# Patient Record
Sex: Female | Born: 1985
Health system: Southern US, Community
[De-identification: ages and names within clinical notes are randomized; demographics above are authoritative.]

## PROBLEM LIST (undated history)

## (undated) DIAGNOSIS — I491 Atrial premature depolarization: Secondary | ICD-10-CM

## (undated) DIAGNOSIS — I7774 Dissection of vertebral artery: Secondary | ICD-10-CM

## (undated) DIAGNOSIS — Z8619 Personal history of other infectious and parasitic diseases: Secondary | ICD-10-CM

## (undated) DIAGNOSIS — R002 Palpitations: Secondary | ICD-10-CM

## (undated) DIAGNOSIS — L700 Acne vulgaris: Secondary | ICD-10-CM

## (undated) HISTORY — DX: Atrial premature depolarization: I49.1

## (undated) HISTORY — PX: WISDOM TOOTH EXTRACTION: SHX21

## (undated) HISTORY — DX: Palpitations: R00.2

## (undated) HISTORY — DX: Personal history of other infectious and parasitic diseases: Z86.19

## (undated) HISTORY — DX: Acne vulgaris: L70.0

---
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
## 2004-04-07 ENCOUNTER — Other Ambulatory Visit: Admission: RE | Admit: 2004-04-07 | Discharge: 2004-04-07 | Payer: Self-pay | Admitting: Family Medicine

## 2005-03-16 ENCOUNTER — Other Ambulatory Visit: Admission: RE | Admit: 2005-03-16 | Discharge: 2005-03-16 | Payer: Self-pay | Admitting: Family Medicine

## 2010-11-07 LAB — RPR: RPR: NONREACTIVE

## 2010-11-07 LAB — HIV ANTIBODY (ROUTINE TESTING W REFLEX): HIV: NONREACTIVE

## 2010-11-07 LAB — GC/CHLAMYDIA PROBE AMP, GENITAL: Chlamydia: NEGATIVE

## 2010-11-07 LAB — ANTIBODY SCREEN: Antibody Screen: NEGATIVE

## 2010-11-07 LAB — HEPATITIS B SURFACE ANTIGEN: Hepatitis B Surface Ag: NEGATIVE

## 2011-03-13 NOTE — L&D Delivery Note (Signed)
Delivery Note At 9:18 PM a viable and healthy female was delivered via Vaginal, Spontaneous Delivery (Presentation: Left Occiput Anterior).  APGAR: 9, 9; weight 8 lb 8.7 oz (3875 g).   Placenta status: Intact, Spontaneous.  Cord: 3 vessels with the following complications: Nuchal, true Knot.    Anesthesia: Epidural  Episiotomy: None Lacerations: 2nd degree;Perineal Suture Repair: 3.0 chromic Est. Blood Loss (mL): 500  Mom to postpartum.  Baby to nursery-stable.  Neal Oshea D 05/28/2011, 9:53 PM

## 2011-05-06 LAB — STREP B DNA PROBE: GBS: NEGATIVE

## 2011-05-24 ENCOUNTER — Encounter (HOSPITAL_COMMUNITY): Payer: Self-pay | Admitting: *Deleted

## 2011-05-24 ENCOUNTER — Telehealth (HOSPITAL_COMMUNITY): Payer: Self-pay | Admitting: *Deleted

## 2011-05-24 NOTE — Telephone Encounter (Signed)
Preadmission screen  

## 2011-05-28 ENCOUNTER — Inpatient Hospital Stay (HOSPITAL_COMMUNITY)
Admission: AD | Admit: 2011-05-28 | Discharge: 2011-05-30 | DRG: 775 | Disposition: A | Discharge status: Home / Self Care | Payer: 59 | Attending: Obstetrics & Gynecology | Admitting: Obstetrics & Gynecology

## 2011-05-28 ENCOUNTER — Encounter (HOSPITAL_COMMUNITY): Payer: Self-pay | Admitting: *Deleted

## 2011-05-28 ENCOUNTER — Inpatient Hospital Stay (HOSPITAL_COMMUNITY): Payer: 59 | Admitting: Anesthesiology

## 2011-05-28 ENCOUNTER — Encounter (HOSPITAL_COMMUNITY): Payer: Self-pay | Admitting: Anesthesiology

## 2011-05-28 DIAGNOSIS — O99814 Abnormal glucose complicating childbirth: Secondary | ICD-10-CM | POA: Diagnosis present

## 2011-05-28 LAB — CBC
HCT: 36.3 % (ref 36.0–46.0)
RBC: 4.77 MIL/uL (ref 3.87–5.11)
RDW: 15.2 % (ref 11.5–15.5)
WBC: 12.8 10*3/uL — ABNORMAL HIGH (ref 4.0–10.5)

## 2011-05-28 MED ORDER — PRENATAL MULTIVITAMIN CH
1.0000 | ORAL_TABLET | Freq: Every day | ORAL | Status: DC
  Administered 2011-05-29: 1 via ORAL
  Filled 2011-05-28 (×2): qty 1

## 2011-05-28 MED ORDER — DIPHENHYDRAMINE HCL 25 MG PO CAPS: 25.0000 mg | ORAL_CAPSULE | Freq: Four times a day (QID) | ORAL | Status: DC | PRN

## 2011-05-28 MED ORDER — IBUPROFEN 600 MG PO TABS
600.0000 mg | ORAL_TABLET | Freq: Four times a day (QID) | ORAL | Status: DC | PRN
  Administered 2011-05-28: 600 mg via ORAL
  Filled 2011-05-28: qty 1

## 2011-05-28 MED ORDER — SIMETHICONE 80 MG PO CHEW: 80.0000 mg | CHEWABLE_TABLET | ORAL | Status: DC | PRN

## 2011-05-28 MED ORDER — ONDANSETRON HCL 4 MG PO TABS: 4.0000 mg | ORAL_TABLET | ORAL | Status: DC | PRN

## 2011-05-28 MED ORDER — LACTATED RINGERS IV SOLN: 500.0000 mL | Freq: Once | INTRAVENOUS | Status: DC

## 2011-05-28 MED ORDER — ZOLPIDEM TARTRATE 5 MG PO TABS: 5.0000 mg | ORAL_TABLET | Freq: Every evening | ORAL | Status: DC | PRN

## 2011-05-28 MED ORDER — FENTANYL 2.5 MCG/ML BUPIVACAINE 1/10 % EPIDURAL INFUSION (WH - ANES)
14.0000 mL/h | INTRAMUSCULAR | Status: DC
  Administered 2011-05-28: 14 mL/h via EPIDURAL
  Filled 2011-05-28 (×2): qty 60

## 2011-05-28 MED ORDER — BENZOCAINE-MENTHOL 20-0.5 % EX AERO
1.0000 "application " | INHALATION_SPRAY | CUTANEOUS | Status: DC | PRN
  Administered 2011-05-29: 1 via TOPICAL

## 2011-05-28 MED ORDER — EPHEDRINE 5 MG/ML INJ
10.0000 mg | INTRAVENOUS | Status: DC | PRN
  Filled 2011-05-28: qty 4

## 2011-05-28 MED ORDER — FLEET ENEMA 7-19 GM/118ML RE ENEM: 1.0000 | ENEMA | RECTAL | Status: DC | PRN

## 2011-05-28 MED ORDER — LIDOCAINE HCL (PF) 1 % IJ SOLN
INTRAMUSCULAR | Status: DC | PRN
  Administered 2011-05-28 (×2): 4 mL
  Administered 2011-05-28: 7 mL
  Administered 2011-05-28: 4 mL
  Administered 2011-05-28: 10 mL

## 2011-05-28 MED ORDER — OXYCODONE-ACETAMINOPHEN 5-325 MG PO TABS: 1.0000 | ORAL_TABLET | ORAL | Status: DC | PRN

## 2011-05-28 MED ORDER — ONDANSETRON HCL 4 MG/2ML IJ SOLN: 4.0000 mg | Freq: Four times a day (QID) | INTRAMUSCULAR | Status: DC | PRN

## 2011-05-28 MED ORDER — BUPIVACAINE HCL (PF) 0.25 % IJ SOLN
INTRAMUSCULAR | Status: DC | PRN
  Administered 2011-05-28 (×2): 5 mL

## 2011-05-28 MED ORDER — PHENYLEPHRINE 40 MCG/ML (10ML) SYRINGE FOR IV PUSH (FOR BLOOD PRESSURE SUPPORT): 80.0000 ug | PREFILLED_SYRINGE | INTRAVENOUS | Status: DC | PRN

## 2011-05-28 MED ORDER — DIBUCAINE 1 % RE OINT: 1.0000 "application " | TOPICAL_OINTMENT | RECTAL | Status: DC | PRN

## 2011-05-28 MED ORDER — BUTORPHANOL TARTRATE 2 MG/ML IJ SOLN
1.0000 mg | INTRAMUSCULAR | Status: DC | PRN
  Administered 2011-05-28: 1 mg via INTRAVENOUS
  Filled 2011-05-28: qty 1

## 2011-05-28 MED ORDER — TERBUTALINE SULFATE 1 MG/ML IJ SOLN: 0.2500 mg | Freq: Once | INTRAMUSCULAR | Status: DC | PRN

## 2011-05-28 MED ORDER — LIDOCAINE HCL (PF) 1 % IJ SOLN
30.0000 mL | INTRAMUSCULAR | Status: DC | PRN
  Filled 2011-05-28: qty 30

## 2011-05-28 MED ORDER — PHENYLEPHRINE 40 MCG/ML (10ML) SYRINGE FOR IV PUSH (FOR BLOOD PRESSURE SUPPORT)
80.0000 ug | PREFILLED_SYRINGE | INTRAVENOUS | Status: DC | PRN
  Filled 2011-05-28: qty 5

## 2011-05-28 MED ORDER — IBUPROFEN 600 MG PO TABS
600.0000 mg | ORAL_TABLET | Freq: Four times a day (QID) | ORAL | Status: DC
  Administered 2011-05-29 – 2011-05-30 (×6): 600 mg via ORAL
  Filled 2011-05-28 (×6): qty 1

## 2011-05-28 MED ORDER — FENTANYL 2.5 MCG/ML BUPIVACAINE 1/10 % EPIDURAL INFUSION (WH - ANES)
INTRAMUSCULAR | Status: DC | PRN
  Administered 2011-05-28: 14 mL/h via EPIDURAL

## 2011-05-28 MED ORDER — TETANUS-DIPHTH-ACELL PERTUSSIS 5-2.5-18.5 LF-MCG/0.5 IM SUSP: 0.5000 mL | Freq: Once | INTRAMUSCULAR | Status: DC

## 2011-05-28 MED ORDER — SENNOSIDES-DOCUSATE SODIUM 8.6-50 MG PO TABS
2.0000 | ORAL_TABLET | Freq: Every day | ORAL | Status: DC
  Administered 2011-05-29: 2 via ORAL

## 2011-05-28 MED ORDER — OXYTOCIN 20 UNITS IN LACTATED RINGERS INFUSION - SIMPLE
1.0000 m[IU]/min | INTRAVENOUS | Status: DC
  Administered 2011-05-28: 2 m[IU]/min via INTRAVENOUS
  Filled 2011-05-28: qty 1000

## 2011-05-28 MED ORDER — LACTATED RINGERS IV SOLN
500.0000 mL | INTRAVENOUS | Status: DC | PRN
Start: 2011-05-28 — End: 2011-05-28
  Administered 2011-05-28: 20:00:00 via INTRAVENOUS

## 2011-05-28 MED ORDER — DIPHENHYDRAMINE HCL 50 MG/ML IJ SOLN: 12.5000 mg | INTRAMUSCULAR | Status: DC | PRN

## 2011-05-28 MED ORDER — OXYTOCIN BOLUS FROM INFUSION
500.0000 mL | Freq: Once | INTRAVENOUS | Status: DC
  Filled 2011-05-28: qty 500

## 2011-05-28 MED ORDER — LANOLIN HYDROUS EX OINT: TOPICAL_OINTMENT | CUTANEOUS | Status: DC | PRN

## 2011-05-28 MED ORDER — EPHEDRINE 5 MG/ML INJ: 10.0000 mg | INTRAVENOUS | Status: DC | PRN

## 2011-05-28 MED ORDER — CITRIC ACID-SODIUM CITRATE 334-500 MG/5ML PO SOLN: 30.0000 mL | ORAL | Status: DC | PRN

## 2011-05-28 MED ORDER — OXYCODONE-ACETAMINOPHEN 5-325 MG PO TABS
1.0000 | ORAL_TABLET | ORAL | Status: DC | PRN
Start: 2011-05-28 — End: 2011-05-28

## 2011-05-28 MED ORDER — ONDANSETRON HCL 4 MG/2ML IJ SOLN: 4.0000 mg | INTRAMUSCULAR | Status: DC | PRN

## 2011-05-28 MED ORDER — WITCH HAZEL-GLYCERIN EX PADS: 1.0000 "application " | MEDICATED_PAD | CUTANEOUS | Status: DC | PRN

## 2011-05-28 MED ORDER — OXYTOCIN 20 UNITS IN LACTATED RINGERS INFUSION - SIMPLE
125.0000 mL/h | Freq: Once | INTRAVENOUS | Status: AC
  Administered 2011-05-28: 22:00:00 via INTRAVENOUS

## 2011-05-28 MED ORDER — ACETAMINOPHEN 325 MG PO TABS: 650.0000 mg | ORAL_TABLET | ORAL | Status: DC | PRN

## 2011-05-28 MED ORDER — LACTATED RINGERS IV SOLN
INTRAVENOUS | Status: DC
  Administered 2011-05-28 (×3): via INTRAVENOUS

## 2011-05-28 NOTE — H&P (Signed)
26 y.o. G1P0  Estimated Date of Delivery: 05/25/11 admitted at 40/[redacted] weeks gestation in labor. Prenatal course was uncomplicated. Prenatal labs: Blood Type:A+.  Screening tests for HIV, Syphilis, Hepatitis B, Rubella sensitivity, gestational diabetes, and perineal group B strep colonization were negative.  Patient declined early fetal screens.  Afebrile, VSS Heart and Lungs: No active disease Abdomen: soft, gravid, EFW AGA. Cervical exam:  4/80 vtx. -2  Impression: Early labor  Plan:  AROM (clear).  Augment if necessary

## 2011-05-28 NOTE — Anesthesia Procedure Notes (Signed)
Epidural Patient location during procedure: OB Start time: 05/28/2011 6:13 PM End time: 05/28/2011 6:19 PM Reason for block: procedure for pain  Staffing Anesthesiologist: Sandrea Hughs Performed by: anesthesiologist   Preanesthetic Checklist Completed: patient identified, site marked, surgical consent, pre-op evaluation, timeout performed, IV checked, risks and benefits discussed and monitors and equipment checked  Epidural Patient position: sitting Prep: site prepped and draped and DuraPrep Patient monitoring: continuous pulse ox and blood pressure Approach: midline Injection technique: LOR air  Needle:  Needle type: Tuohy  Needle gauge: 17 G Needle length: 9 cm Needle insertion depth: 7 cm Catheter type: closed end flexible Catheter size: 19 Gauge Catheter at skin depth: 12 cm Test dose: negative and Other  Assessment Events: blood not aspirated, injection not painful, no injection resistance, negative IV test and no paresthesia

## 2011-05-28 NOTE — Anesthesia Preprocedure Evaluation (Signed)
Anesthesia Evaluation  Patient identified by MRN, date of birth, ID band Patient awake    Reviewed: Allergy & Precautions, H&P , NPO status , Patient's Chart, lab work & pertinent test results  Airway Mallampati: III TM Distance: >3 FB Neck ROM: full    Dental No notable dental hx.    Pulmonary neg pulmonary ROS,    Pulmonary exam normal       Cardiovascular negative cardio ROS      Neuro/Psych negative neurological ROS  negative psych ROS   GI/Hepatic negative GI ROS, Neg liver ROS,   Endo/Other  Morbid obesity  Renal/GU negative Renal ROS  negative genitourinary   Musculoskeletal negative musculoskeletal ROS (+)   Abdominal (+) + obese,   Peds negative pediatric ROS (+)  Hematology negative hematology ROS (+)   Anesthesia Other Findings   Reproductive/Obstetrics (+) Pregnancy                           Anesthesia Physical Anesthesia Plan  ASA: III  Anesthesia Plan: Epidural   Post-op Pain Management:    Induction:   Airway Management Planned:   Additional Equipment:   Intra-op Plan:   Post-operative Plan:   Informed Consent: I have reviewed the patients History and Physical, chart, labs and discussed the procedure including the risks, benefits and alternatives for the proposed anesthesia with the patient or authorized representative who has indicated his/her understanding and acceptance.     Plan Discussed with:   Anesthesia Plan Comments:         Anesthesia Quick Evaluation  

## 2011-05-29 LAB — CBC
HCT: 28.4 % — ABNORMAL LOW (ref 36.0–46.0)
MCH: 23.8 pg — ABNORMAL LOW (ref 26.0–34.0)
MCHC: 31.3 g/dL (ref 30.0–36.0)
MCV: 75.9 fL — ABNORMAL LOW (ref 78.0–100.0)
Platelets: 213 10*3/uL (ref 150–400)
RDW: 15.4 % (ref 11.5–15.5)
WBC: 18.3 10*3/uL — ABNORMAL HIGH (ref 4.0–10.5)

## 2011-05-29 MED ORDER — BENZOCAINE-MENTHOL 20-0.5 % EX AERO
INHALATION_SPRAY | CUTANEOUS | Status: AC
  Administered 2011-05-29: 1 via TOPICAL
  Filled 2011-05-29: qty 56

## 2011-05-29 NOTE — Addendum Note (Signed)
Addendum  created 05/29/11 1028 by Suella Grove, CRNA   Modules edited:Charges VN, Notes Section

## 2011-05-29 NOTE — Progress Notes (Signed)
Patient is eating, ambulating, voiding.  Pain control is good. Appropriate lochia. No complaints.  Filed Vitals:   05/28/11 2252 05/28/11 2318 05/29/11 0015 05/29/11 0405  BP:  109/74 112/61 107/65  Pulse:  101 98 77  Temp:  98.4 F (36.9 C) 98.3 F (36.8 C) 97.8 F (36.6 C)  TempSrc:   Oral Oral  Resp: 20  18 20   Height:      Weight:      SpO2:  96%      Fundus firm Perineum without swelling. No CT  Lab Results  Component Value Date   WBC 18.3* 05/29/2011   HGB 8.9* 05/29/2011   HCT 28.4* 05/29/2011   MCV 75.9* 05/29/2011   PLT 213 05/29/2011    A/Positive/-- (08/28 0000)/RI A/P Post partum day 1 (9pm delivery). Pt desires circ, consented.  RN states baby is not feeding request waiting for circ.  Ask nurse to call me with improved feeding, will try to circ this afternoon.  Routine care.  Expect d/c 3/20.    Laurie Mullins

## 2011-05-29 NOTE — Anesthesia Postprocedure Evaluation (Signed)
  Anesthesia Post-op Note  Patient: Laurie Mullins  Procedure(s) Performed: * No procedures listed *  Patient Location: Mother/Baby  Anesthesia Type: Epidural  Level of Consciousness: awake  Airway and Oxygen Therapy: Patient Spontanous Breathing  Post-op Pain: none  Post-op Assessment: Patient's Cardiovascular Status Stable, Respiratory Function Stable, Patent Airway, No signs of Nausea or vomiting, Adequate PO intake, Pain level controlled, No headache, No backache, No residual numbness and No residual motor weakness  Post-op Vital Signs: Reviewed and stable  Complications: No apparent anesthesia complications

## 2011-05-29 NOTE — Anesthesia Postprocedure Evaluation (Signed)
Anesthesia Post Note  Patient: Laurie Mullins  Procedure(s) Performed: * No procedures listed *  Anesthesia type: Epidural  Patient location: Mother/Baby  Post pain: Pain level controlled  Post assessment: Post-op Vital signs reviewed  Last Vitals:  Filed Vitals:   05/29/11 0405  BP: 107/65  Pulse: 77  Temp: 36.6 C  Resp: 20    Post vital signs: Reviewed  Level of consciousness: awake  Complications: No apparent anesthesia complications

## 2011-05-29 NOTE — Addendum Note (Signed)
Addendum  created 05/29/11 1028 by Maytal Mijangos C Karsin Pesta, CRNA   Modules edited:Charges VN, Notes Section    

## 2011-05-30 ENCOUNTER — Inpatient Hospital Stay (HOSPITAL_COMMUNITY): Admission: RE | Admit: 2011-05-30 | Payer: Commercial Managed Care - PPO | Source: Ambulatory Visit

## 2011-05-30 MED ORDER — HYDROCODONE-ACETAMINOPHEN 5-500 MG PO TABS: 1.0000 | ORAL_TABLET | ORAL | Status: AC | PRN

## 2011-05-30 MED ORDER — IBUPROFEN 600 MG PO TABS: 600.0000 mg | ORAL_TABLET | Freq: Four times a day (QID) | ORAL | Status: AC | PRN

## 2011-05-30 NOTE — Discharge Summary (Signed)
Obstetric Discharge Summary Reason for Admission: onset of labor Prenatal Procedures: NST Intrapartum Procedures: spontaneous vaginal delivery Postpartum Procedures: none Complications-Operative and Postpartum: 2nd degree perineal laceration Hemoglobin  Date Value Range Status  05/29/2011 8.9* 12.0-15.0 (g/dL) Final     REPEATED TO VERIFY     DELTA CHECK NOTED     HCT  Date Value Range Status  05/29/2011 28.4* 36.0-46.0 (%) Final    Physical Exam:  General: alert, cooperative and appears stated age 86: appropriate Uterine Fundus: firm  Discharge Diagnoses: Term Pregnancy-delivered  Discharge Information: Date: 05/30/2011 Activity: pelvic rest Diet: routine Medications: PNV, Ibuprofen and Vicodin Condition: stable Instructions: refer to practice specific booklet Discharge to: home Follow-up Information    Follow up with Mickel Baas, MD in 4 weeks. (For postpartum evaluation)    Contact information:   [address] [place] [address] [phone]          Newborn Data: Live born female  Birth Weight: 8 lb 8.7 oz (3875 g) APGAR: 9, 9  Home with mother.  Makiyah Zentz H. 05/30/2011, 11:49 AM

## 2011-05-30 NOTE — Progress Notes (Signed)
Doing well without complaint Filed Vitals:   05/29/11 1230 05/29/11 1430 05/29/11 2111 05/30/11 0512  BP: 110/72 104/67 105/70 92/53  Pulse: 82 94 98 80  Temp: 98.6 F (37 C) 98.4 F (36.9 C) 97.6 F (36.4 C) 97.4 F (36.3 C)  TempSrc:  Oral Oral Oral  Resp: 18 18 18 18   Height:      Weight:      SpO2:       AOX3, NAD Abd soft, NT  A/P 1) Will D/C home today. Follow-up in office in 4-6 weeks 2) Neonatal circ discussed.  R/B/A reviewed. Pt wishes to proceed

## 2013-03-25 ENCOUNTER — Encounter (HOSPITAL_COMMUNITY): Payer: Self-pay

## 2013-03-25 ENCOUNTER — Other Ambulatory Visit: Payer: Self-pay | Admitting: Obstetrics and Gynecology

## 2013-03-25 NOTE — H&P (Signed)
28 y.o. yo complains of MAB at 6 weeks.  Confirmed by [place] x2 no FHTs.  Past Medical History  Diagnosis Date  . History of chicken pox    Past Surgical History  Procedure Laterality Date  . Wisdom tooth extraction    . Wisdom tooth extraction      History   Social History  . Marital Status: Married    Spouse Name: N/A    Number of Children: N/A  . Years of Education: N/A   Occupational History  . Not on file.   Social History Main Topics  . Smoking status: Never Smoker   . Smokeless tobacco: Never Used  . Alcohol Use: No  . Drug Use: No  . Sexual Activity: Yes   Other Topics Concern  . Not on file   Social History Narrative  . No narrative on file    No current facility-administered medications on file prior to encounter.   Current Outpatient Prescriptions on File Prior to Encounter  Medication Sig Dispense Refill  . Prenatal Vit-Fe Fumarate-FA (PRENATAL MULTIVITAMIN) TABS Take 1 tablet by mouth at bedtime.        No Known Allergies  @VITALS2 @  Lungs: clear to ascultation Cor:  RRR Abdomen:  soft, nontender, nondistended. Ex:  no cords, erythema Pelvic:  7 weeks closed os  A:  For D&E for MAB.   P: All risks, benefits and alternatives d/w patient and she desires to proceed.   Pt is A+ - no rhogam.  Dorell Gatlin A

## 2013-03-26 ENCOUNTER — Encounter (HOSPITAL_COMMUNITY)
Admission: RE | Disposition: A | Discharge status: Home / Self Care | Payer: Self-pay | Source: Ambulatory Visit | Attending: Obstetrics and Gynecology

## 2013-03-26 ENCOUNTER — Ambulatory Visit (HOSPITAL_COMMUNITY)
Admission: RE | Admit: 2013-03-26 | Discharge: 2013-03-26 | Disposition: A | Discharge status: Home / Self Care | Payer: 59 | Source: Ambulatory Visit | Attending: Obstetrics and Gynecology | Admitting: Obstetrics and Gynecology

## 2013-03-26 ENCOUNTER — Ambulatory Visit (HOSPITAL_COMMUNITY): Payer: 59 | Admitting: Anesthesiology

## 2013-03-26 ENCOUNTER — Encounter (HOSPITAL_COMMUNITY): Payer: 59 | Admitting: Anesthesiology

## 2013-03-26 ENCOUNTER — Encounter (HOSPITAL_COMMUNITY): Payer: Self-pay | Admitting: Anesthesiology

## 2013-03-26 DIAGNOSIS — Z9889 Other specified postprocedural states: Secondary | ICD-10-CM

## 2013-03-26 DIAGNOSIS — O021 Missed abortion: Secondary | ICD-10-CM | POA: Insufficient documentation

## 2013-03-26 HISTORY — PX: DILATION AND EVACUATION: SHX1459

## 2013-03-26 LAB — CBC
HEMATOCRIT: 41 % (ref 36.0–46.0)
HEMOGLOBIN: 13.8 g/dL (ref 12.0–15.0)
MCH: 27 pg (ref 26.0–34.0)
MCHC: 33.7 g/dL (ref 30.0–36.0)
MCV: 80.2 fL (ref 78.0–100.0)
Platelets: 232 10*3/uL (ref 150–400)
RBC: 5.11 MIL/uL (ref 3.87–5.11)
RDW: 13.7 % (ref 11.5–15.5)
WBC: 7.8 10*3/uL (ref 4.0–10.5)

## 2013-03-26 SURGERY — DILATION AND EVACUATION, UTERUS
Anesthesia: Monitor Anesthesia Care | Site: Vagina | Laterality: Bilateral

## 2013-03-26 MED ORDER — LACTATED RINGERS IV SOLN
INTRAVENOUS | Status: DC
  Administered 2013-03-26: 11:00:00 via INTRAVENOUS

## 2013-03-26 MED ORDER — FENTANYL CITRATE 0.05 MG/ML IJ SOLN
INTRAMUSCULAR | Status: DC | PRN
  Administered 2013-03-26 (×2): 50 ug via INTRAVENOUS

## 2013-03-26 MED ORDER — LIDOCAINE HCL (CARDIAC) 20 MG/ML IV SOLN
INTRAVENOUS | Status: DC | PRN
  Administered 2013-03-26: 80 mg via INTRAVENOUS

## 2013-03-26 MED ORDER — MIDAZOLAM HCL 2 MG/2ML IJ SOLN
INTRAMUSCULAR | Status: AC
  Filled 2013-03-26: qty 2

## 2013-03-26 MED ORDER — FENTANYL CITRATE 0.05 MG/ML IJ SOLN
INTRAMUSCULAR | Status: AC
  Filled 2013-03-26: qty 2

## 2013-03-26 MED ORDER — KETOROLAC TROMETHAMINE 30 MG/ML IJ SOLN
INTRAMUSCULAR | Status: DC | PRN
  Administered 2013-03-26: 30 mg via INTRAVENOUS

## 2013-03-26 MED ORDER — PROPOFOL 10 MG/ML IV EMUL
INTRAVENOUS | Status: AC
  Filled 2013-03-26: qty 20

## 2013-03-26 MED ORDER — MIDAZOLAM HCL 2 MG/2ML IJ SOLN
INTRAMUSCULAR | Status: DC | PRN
  Administered 2013-03-26: 2 mg via INTRAVENOUS

## 2013-03-26 MED ORDER — ONDANSETRON HCL 4 MG/2ML IJ SOLN
INTRAMUSCULAR | Status: AC
  Filled 2013-03-26: qty 2

## 2013-03-26 MED ORDER — KETOROLAC TROMETHAMINE 30 MG/ML IJ SOLN
INTRAMUSCULAR | Status: AC
  Filled 2013-03-26: qty 1

## 2013-03-26 MED ORDER — LIDOCAINE HCL (CARDIAC) 20 MG/ML IV SOLN
INTRAVENOUS | Status: AC
  Filled 2013-03-26: qty 5

## 2013-03-26 MED ORDER — PROPOFOL 10 MG/ML IV EMUL
INTRAVENOUS | Status: DC | PRN
  Administered 2013-03-26 (×2): 30 mg via INTRAVENOUS
  Administered 2013-03-26: 20 mg via INTRAVENOUS
  Administered 2013-03-26 (×2): 50 mg via INTRAVENOUS
  Administered 2013-03-26: 20 mg via INTRAVENOUS
  Administered 2013-03-26: 50 mg via INTRAVENOUS

## 2013-03-26 MED ORDER — METHYLERGONOVINE MALEATE 0.2 MG/ML IJ SOLN
INTRAMUSCULAR | Status: DC | PRN
  Administered 2013-03-26: 0.2 mg via INTRAMUSCULAR

## 2013-03-26 MED ORDER — DEXAMETHASONE SODIUM PHOSPHATE 10 MG/ML IJ SOLN
INTRAMUSCULAR | Status: AC
  Filled 2013-03-26: qty 1

## 2013-03-26 MED ORDER — DEXAMETHASONE SODIUM PHOSPHATE 10 MG/ML IJ SOLN
INTRAMUSCULAR | Status: DC | PRN
  Administered 2013-03-26: 10 mg via INTRAVENOUS

## 2013-03-26 SURGICAL SUPPLY — 18 items
CATH ROBINSON RED A/P 16FR (CATHETERS) ×3 IMPLANT
CLOTH BEACON ORANGE TIMEOUT ST (SAFETY) ×3 IMPLANT
DECANTER SPIKE VIAL GLASS SM (MISCELLANEOUS) ×3 IMPLANT
GLOVE BIO SURGEON STRL SZ7 (GLOVE) ×3 IMPLANT
GLOVE BIOGEL PI IND STRL 7.0 (GLOVE) ×1 IMPLANT
GLOVE BIOGEL PI INDICATOR 7.0 (GLOVE) ×2
GOWN STRL REIN XL XLG (GOWN DISPOSABLE) ×6 IMPLANT
KIT BERKELEY 1ST TRIMESTER 3/8 (MISCELLANEOUS) ×3 IMPLANT
NS IRRIG 1000ML POUR BTL (IV SOLUTION) ×3 IMPLANT
PACK VAGINAL MINOR WOMEN LF (CUSTOM PROCEDURE TRAY) ×3 IMPLANT
PAD OB MATERNITY 4.3X12.25 (PERSONAL CARE ITEMS) ×3 IMPLANT
PAD PREP 24X48 CUFFED NSTRL (MISCELLANEOUS) ×3 IMPLANT
SET BERKELEY SUCTION TUBING (SUCTIONS) ×3 IMPLANT
TOWEL OR 17X24 6PK STRL BLUE (TOWEL DISPOSABLE) ×6 IMPLANT
VACURETTE 10 RIGID CVD (CANNULA) IMPLANT
VACURETTE 7MM CVD STRL WRAP (CANNULA) IMPLANT
VACURETTE 8 RIGID CVD (CANNULA) IMPLANT
VACURETTE 9 RIGID CVD (CANNULA) ×3 IMPLANT

## 2013-03-26 NOTE — Op Note (Signed)
03/26/2013  12:15 PM  PATIENT:  Laurie Mullins  28 y.o. female  PRE-OPERATIVE DIAGNOSIS:  Missed abortion   POST-OPERATIVE DIAGNOSIS:  Missed Abortion   PROCEDURE:  Procedure(s): DILATATION AND EVACUATION (Bilateral)  SURGEON:  Surgeon(s) and Role:    * Donta Fuster A Xzavien Harada, MD - Primary  PHYSICIAN ASSISTANT:   ASSISTANTS: none   ANESTHESIA:   MAC  EBL:   min   SPECIMEN:  Source of Specimen:  POC  DISPOSITION OF SPECIMEN:  PATHOLOGY  COUNTS:  YES  TOURNIQUET:  * No tourniquets in log *  DICTATION: .Note written in EPIC  PLAN OF CARE: Discharge to home after PACU  PATIENT DISPOSITION:  PACU - hemodynamically stable.   Delay start of Pharmacological VTE agent (>24hrs) due to surgical blood loss or risk of bleeding: not applicable  Medications: Methergine  Complications: None  Findings:  8 week size uterus to 7 size post procedure.  Good crie was achieved.  After adequate anesthesia was achieved, the patient was prepped and draped in the usual sterile fashion.  The speculum was placed in the vagina and the cervix stabilized with a single-tooth tenaculum.  The cervix was dilated with Pratt dilators and the 9 mm curette was used to remove contents of the uterus.  Alternating sharp curettage with a curette and suction curettage was performed until all contents were removed and good crie was achieved.  All instruments were removed from the vagina.  The patient tolerated the procedure well.    Maura Braaten A      

## 2013-03-26 NOTE — Discharge Instructions (Addendum)
° ° °  DO NOT TAKE IBUPROFEN PRODUCTS UNTIL 6 PM TONIGHT.     DISCHARGE INSTRUCTIONS: D&C / D&E The following instructions have been prepared to help you care for yourself upon your return home.   Personal hygiene:  Use sanitary pads for vaginal drainage, not tampons.  Shower the day after your procedure.  NO tub baths, pools or Jacuzzis for 2-3 weeks.  Wipe front to back after using the bathroom.  Activity and limitations:  Do NOT drive or operate any equipment for 24 hours. The effects of anesthesia are still present and drowsiness may result.  Do NOT rest in bed all day.  Walking is encouraged.  Walk up and down stairs slowly.  You may resume your normal activity in one to two days or as indicated by your physician.  Sexual activity: NO intercourse for at least 2 weeks after the procedure, or as indicated by your physician.  Diet: Eat a light meal as desired this evening. You may resume your usual diet tomorrow.  Return to work: You may resume your work activities in one to two days or as indicated by your doctor.  What to expect after your surgery: Expect to have vaginal bleeding/discharge for 2-3 days and spotting for up to 10 days. It is not unusual to have soreness for up to 1-2 weeks. You may have a slight burning sensation when you urinate for the first day. Mild cramps may continue for a couple of days. You may have a regular period in 2-6 weeks.  Call your doctor for any of the following:  Excessive vaginal bleeding, saturating and changing one pad every hour.  Inability to urinate 6 hours after discharge from hospital.  Pain not relieved by pain medication.  Fever of 100.4 F or greater.  Unusual vaginal discharge or odor.   Call for an appointment:    Patients signature: ______________________  Nurses signature ________________________  Support person's signature_______________________    DO NOT RETURN TO WORK AT [place]  04/03/13.

## 2013-03-26 NOTE — Transfer of Care (Signed)
Immediate Anesthesia Transfer of Care Note  Patient: Laurie Mullins  Procedure(s) Performed: Procedure(s): DILATATION AND EVACUATION (Bilateral)  Patient Location: PACU  Anesthesia Type:MAC  Level of Consciousness: awake, alert  and oriented  Airway & Oxygen Therapy: Patient Spontanous Breathing and Patient connected to nasal cannula oxygen  Post-op Assessment: Report given to PACU RN, Post -op Vital signs reviewed and stable and Patient moving all extremities  Post vital signs: Reviewed and stable  Complications: No apparent anesthesia complications

## 2013-03-26 NOTE — Progress Notes (Signed)
There has been no change in the patients history, status or exam since the history and physical.  Filed Vitals:   03/26/13 1035  BP: 112/69  Pulse: 86  Temp: 97.9 F (36.6 C)  TempSrc: Oral  Resp: 18  Height: 5\' 8"  (1.727 m)  Weight: 102.513 kg (226 lb)  SpO2: 100%    Lab Results  Component Value Date   WBC 7.8 03/26/2013   HGB 13.8 03/26/2013   HCT 41.0 03/26/2013   MCV 80.2 03/26/2013   PLT 232 03/26/2013    Laurie Mullins A

## 2013-03-26 NOTE — Brief Op Note (Signed)
03/26/2013  12:15 PM  PATIENT:  Laurie Mullins  28 y.o. female  PRE-OPERATIVE DIAGNOSIS:  Missed abortion   POST-OPERATIVE DIAGNOSIS:  Missed Abortion   PROCEDURE:  Procedure(s): DILATATION AND EVACUATION (Bilateral)  SURGEON:  Surgeon(s) and Role:    * Daria Pastures, MD - Primary  PHYSICIAN ASSISTANT:   ASSISTANTS: none   ANESTHESIA:   MAC  EBL:   min   SPECIMEN:  Source of Specimen:  POC  DISPOSITION OF SPECIMEN:  PATHOLOGY  COUNTS:  YES  TOURNIQUET:  * No tourniquets in log *  DICTATION: .Note written in EPIC  PLAN OF CARE: Discharge to home after PACU  PATIENT DISPOSITION:  PACU - hemodynamically stable.   Delay start of Pharmacological VTE agent (>24hrs) due to surgical blood loss or risk of bleeding: not applicable  Medications: Methergine  Complications: None  Findings:  8 week size uterus to 7 size post procedure.  Good crie was achieved.  After adequate anesthesia was achieved, the patient was prepped and draped in the usual sterile fashion.  The speculum was placed in the vagina and the cervix stabilized with a single-tooth tenaculum.  The cervix was dilated with Kennon Rounds dilators and the 9 mm curette was used to remove contents of the uterus.  Alternating sharp curettage with a curette and suction curettage was performed until all contents were removed and good crie was achieved.  All instruments were removed from the vagina.  The patient tolerated the procedure well.    Josede Cicero A

## 2013-03-26 NOTE — Anesthesia Postprocedure Evaluation (Signed)
Anesthesia Post Note  Patient: Laurie Mullins  Procedure(s) Performed: Procedure(s) (LRB): DILATATION AND EVACUATION (Bilateral)  Anesthesia type: MAC  Patient location: PACU  Post pain: Pain level controlled  Post assessment: Post-op Vital signs reviewed  Last Vitals:  Filed Vitals:   03/26/13 1224  BP: 92/61  Pulse: 78  Temp: 36.6 C  Resp: 18    Post vital signs: Reviewed  Level of consciousness: sedated  Complications: No apparent anesthesia complications

## 2013-03-26 NOTE — Anesthesia Preprocedure Evaluation (Signed)
Anesthesia Evaluation  Patient identified by MRN, date of birth, ID band Patient awake    Reviewed: Allergy & Precautions, H&P , NPO status , Patient's Chart, lab work & pertinent test results, reviewed documented beta blocker date and time   Airway Mallampati: I TM Distance: >3 FB Neck ROM: full    Dental no notable dental hx.    Pulmonary neg pulmonary ROS,    Pulmonary exam normal       Cardiovascular negative cardio ROS      Neuro/Psych negative neurological ROS  negative psych ROS   GI/Hepatic negative GI ROS, Neg liver ROS,   Endo/Other  negative endocrine ROS  Renal/GU negative Renal ROS  negative genitourinary   Musculoskeletal negative musculoskeletal ROS (+)   Abdominal Normal abdominal exam  (+)   Peds  Hematology negative hematology ROS (+)   Anesthesia Other Findings   Reproductive/Obstetrics negative OB ROS                           Anesthesia Physical Anesthesia Plan  ASA: II  Anesthesia Plan: MAC   Post-op Pain Management:    Induction: Intravenous  Airway Management Planned:   Additional Equipment:   Intra-op Plan:   Post-operative Plan:   Informed Consent: I have reviewed the patients History and Physical, chart, labs and discussed the procedure including the risks, benefits and alternatives for the proposed anesthesia with the patient or authorized representative who has indicated his/her understanding and acceptance.     Plan Discussed with: CRNA and Surgeon  Anesthesia Plan Comments:         Anesthesia Quick Evaluation

## 2013-03-27 ENCOUNTER — Encounter (HOSPITAL_COMMUNITY): Payer: Self-pay | Admitting: Obstetrics and Gynecology

## 2013-09-22 LAB — PACEMAKER DEVICE OBSERVATION

## 2013-09-23 ENCOUNTER — Encounter: Payer: Self-pay | Admitting: Cardiovascular Disease

## 2014-01-07 ENCOUNTER — Other Ambulatory Visit: Payer: Self-pay | Admitting: Obstetrics and Gynecology

## 2014-01-07 LAB — OB RESULTS CONSOLE GC/CHLAMYDIA
Chlamydia: NEGATIVE
GC PROBE AMP, GENITAL: NEGATIVE

## 2014-01-11 ENCOUNTER — Encounter (HOSPITAL_COMMUNITY): Payer: Self-pay | Admitting: Obstetrics and Gynecology

## 2014-01-22 LAB — OB RESULTS CONSOLE HIV ANTIBODY (ROUTINE TESTING): HIV: NONREACTIVE

## 2014-01-22 LAB — OB RESULTS CONSOLE ABO/RH: RH TYPE: POSITIVE

## 2014-01-22 LAB — OB RESULTS CONSOLE ANTIBODY SCREEN: Antibody Screen: NEGATIVE

## 2014-01-22 LAB — OB RESULTS CONSOLE RUBELLA ANTIBODY, IGM: Rubella: IMMUNE

## 2014-01-22 LAB — OB RESULTS CONSOLE HEPATITIS B SURFACE ANTIGEN: Hepatitis B Surface Ag: NEGATIVE

## 2014-01-22 LAB — OB RESULTS CONSOLE RPR: RPR: NONREACTIVE

## 2014-03-12 NOTE — L&D Delivery Note (Signed)
Delivery Note At 11:40 AM a viable and healthy female was delivered via Vaginal, Spontaneous Delivery (Presentation:Left ; Occiput Posterior).  APGAR: 9, 9; weight 9 lb 8.7 oz (4330 g).   Placenta status: Intact, Spontaneous.  Cord: 3 vessels with the following complications: None.   Anesthesia: Epidural  Episiotomy: None Lacerations: None Suture Repair: none Est. Blood Loss (mL): 263  Mom to postpartum.  Baby to Couplet care / Skin to Skin.  Arien Benincasa H. 08/06/2014, 1:59 PM

## 2014-07-07 ENCOUNTER — Other Ambulatory Visit: Payer: Self-pay | Admitting: Obstetrics & Gynecology

## 2014-07-07 LAB — OB RESULTS CONSOLE GBS: GBS: POSITIVE

## 2014-08-06 ENCOUNTER — Inpatient Hospital Stay (HOSPITAL_COMMUNITY): Payer: 59 | Admitting: Anesthesiology

## 2014-08-06 ENCOUNTER — Inpatient Hospital Stay (HOSPITAL_COMMUNITY)
Admission: AD | Admit: 2014-08-06 | Discharge: 2014-08-08 | DRG: 775 | Disposition: A | Discharge status: Home / Self Care | Payer: 59 | Source: Ambulatory Visit | Attending: Obstetrics & Gynecology | Admitting: Obstetrics & Gynecology

## 2014-08-06 ENCOUNTER — Encounter (HOSPITAL_COMMUNITY): Payer: Self-pay | Admitting: *Deleted

## 2014-08-06 DIAGNOSIS — Z3A40 40 weeks gestation of pregnancy: Secondary | ICD-10-CM | POA: Diagnosis present

## 2014-08-06 DIAGNOSIS — O480 Post-term pregnancy: Secondary | ICD-10-CM | POA: Diagnosis present

## 2014-08-06 DIAGNOSIS — O99824 Streptococcus B carrier state complicating childbirth: Secondary | ICD-10-CM | POA: Diagnosis present

## 2014-08-06 DIAGNOSIS — IMO0001 Reserved for inherently not codable concepts without codable children: Secondary | ICD-10-CM

## 2014-08-06 DIAGNOSIS — Z3403 Encounter for supervision of normal first pregnancy, third trimester: Secondary | ICD-10-CM | POA: Diagnosis present

## 2014-08-06 LAB — CBC
HEMATOCRIT: 31.6 % — AB (ref 36.0–46.0)
Hemoglobin: 9.6 g/dL — ABNORMAL LOW (ref 12.0–15.0)
MCH: 20.8 pg — ABNORMAL LOW (ref 26.0–34.0)
MCHC: 30.4 g/dL (ref 30.0–36.0)
MCV: 68.4 fL — ABNORMAL LOW (ref 78.0–100.0)
PLATELETS: 257 10*3/uL (ref 150–400)
RBC: 4.62 MIL/uL (ref 3.87–5.11)
RDW: 17 % — ABNORMAL HIGH (ref 11.5–15.5)
WBC: 12.6 10*3/uL — AB (ref 4.0–10.5)

## 2014-08-06 LAB — ABO/RH: ABO/RH(D): A POS

## 2014-08-06 LAB — TYPE AND SCREEN
ABO/RH(D): A POS
Antibody Screen: NEGATIVE

## 2014-08-06 LAB — RPR: RPR Ser Ql: NONREACTIVE

## 2014-08-06 MED ORDER — FENTANYL 2.5 MCG/ML BUPIVACAINE 1/10 % EPIDURAL INFUSION (WH - ANES)
14.0000 mL/h | INTRAMUSCULAR | Status: DC | PRN
  Administered 2014-08-06: 14.5 mL/h via EPIDURAL
  Filled 2014-08-06: qty 125

## 2014-08-06 MED ORDER — LIDOCAINE HCL (PF) 1 % IJ SOLN
30.0000 mL | INTRAMUSCULAR | Status: DC | PRN
  Filled 2014-08-06: qty 30

## 2014-08-06 MED ORDER — CITRIC ACID-SODIUM CITRATE 334-500 MG/5ML PO SOLN: 30.0000 mL | ORAL | Status: DC | PRN

## 2014-08-06 MED ORDER — PHENYLEPHRINE 40 MCG/ML (10ML) SYRINGE FOR IV PUSH (FOR BLOOD PRESSURE SUPPORT)
80.0000 ug | PREFILLED_SYRINGE | INTRAVENOUS | Status: AC | PRN
  Administered 2014-08-06 (×3): 80 ug via INTRAVENOUS
  Filled 2014-08-06: qty 20

## 2014-08-06 MED ORDER — PENICILLIN G POTASSIUM 5000000 UNITS IJ SOLR
5.0000 10*6.[IU] | Freq: Once | INTRAVENOUS | Status: AC
  Administered 2014-08-06: 5 10*6.[IU] via INTRAVENOUS
  Filled 2014-08-06: qty 5

## 2014-08-06 MED ORDER — ONDANSETRON HCL 4 MG PO TABS: 4.0000 mg | ORAL_TABLET | ORAL | Status: DC | PRN

## 2014-08-06 MED ORDER — TETANUS-DIPHTH-ACELL PERTUSSIS 5-2.5-18.5 LF-MCG/0.5 IM SUSP: 0.5000 mL | Freq: Once | INTRAMUSCULAR | Status: DC

## 2014-08-06 MED ORDER — OXYCODONE-ACETAMINOPHEN 5-325 MG PO TABS: 2.0000 | ORAL_TABLET | ORAL | Status: DC | PRN

## 2014-08-06 MED ORDER — LIDOCAINE HCL (PF) 1 % IJ SOLN
INTRAMUSCULAR | Status: DC | PRN
  Administered 2014-08-06 (×2): 4 mL

## 2014-08-06 MED ORDER — ONDANSETRON HCL 4 MG/2ML IJ SOLN: 4.0000 mg | Freq: Four times a day (QID) | INTRAMUSCULAR | Status: DC | PRN

## 2014-08-06 MED ORDER — SENNOSIDES-DOCUSATE SODIUM 8.6-50 MG PO TABS
2.0000 | ORAL_TABLET | ORAL | Status: DC
  Administered 2014-08-06: 2 via ORAL
  Filled 2014-08-06 (×2): qty 2

## 2014-08-06 MED ORDER — OXYTOCIN 40 UNITS IN LACTATED RINGERS INFUSION - SIMPLE MED
62.5000 mL/h | INTRAVENOUS | Status: DC
  Filled 2014-08-06: qty 1000

## 2014-08-06 MED ORDER — ZOLPIDEM TARTRATE 5 MG PO TABS
5.0000 mg | ORAL_TABLET | Freq: Every evening | ORAL | Status: DC | PRN
Start: 2014-08-06 — End: 2014-08-08

## 2014-08-06 MED ORDER — OXYTOCIN BOLUS FROM INFUSION: 500.0000 mL | INTRAVENOUS | Status: DC

## 2014-08-06 MED ORDER — PENICILLIN G POTASSIUM 5000000 UNITS IJ SOLR
2.5000 10*6.[IU] | INTRAVENOUS | Status: DC
  Administered 2014-08-06: 2.5 10*6.[IU] via INTRAVENOUS
  Filled 2014-08-06 (×6): qty 2.5

## 2014-08-06 MED ORDER — LACTATED RINGERS IV SOLN
500.0000 mL | INTRAVENOUS | Status: DC | PRN
  Administered 2014-08-06: 500 mL via INTRAVENOUS

## 2014-08-06 MED ORDER — EPHEDRINE 5 MG/ML INJ
10.0000 mg | INTRAVENOUS | Status: DC | PRN
  Filled 2014-08-06: qty 2

## 2014-08-06 MED ORDER — LANOLIN HYDROUS EX OINT: TOPICAL_OINTMENT | CUTANEOUS | Status: DC | PRN

## 2014-08-06 MED ORDER — BENZOCAINE-MENTHOL 20-0.5 % EX AERO
1.0000 "application " | INHALATION_SPRAY | CUTANEOUS | Status: DC | PRN
  Administered 2014-08-06 – 2014-08-08 (×2): 1 via TOPICAL
  Filled 2014-08-06 (×2): qty 56

## 2014-08-06 MED ORDER — IBUPROFEN 600 MG PO TABS
600.0000 mg | ORAL_TABLET | Freq: Four times a day (QID) | ORAL | Status: DC
  Administered 2014-08-06 – 2014-08-08 (×9): 600 mg via ORAL
  Filled 2014-08-06 (×9): qty 1

## 2014-08-06 MED ORDER — METHYLERGONOVINE MALEATE 0.2 MG/ML IJ SOLN: 0.2000 mg | INTRAMUSCULAR | Status: DC | PRN

## 2014-08-06 MED ORDER — FENTANYL 2.5 MCG/ML BUPIVACAINE 1/10 % EPIDURAL INFUSION (WH - ANES)
14.0000 mL/h | INTRAMUSCULAR | Status: DC | PRN
  Administered 2014-08-06: 14 mL/h via EPIDURAL

## 2014-08-06 MED ORDER — WITCH HAZEL-GLYCERIN EX PADS: 1.0000 "application " | MEDICATED_PAD | CUTANEOUS | Status: DC | PRN

## 2014-08-06 MED ORDER — DIBUCAINE 1 % RE OINT
1.0000 "application " | TOPICAL_OINTMENT | RECTAL | Status: DC | PRN
  Administered 2014-08-08: 1 via RECTAL
  Filled 2014-08-06: qty 28

## 2014-08-06 MED ORDER — SIMETHICONE 80 MG PO CHEW: 80.0000 mg | CHEWABLE_TABLET | ORAL | Status: DC | PRN

## 2014-08-06 MED ORDER — PRENATAL MULTIVITAMIN CH
1.0000 | ORAL_TABLET | Freq: Every day | ORAL | Status: DC
  Administered 2014-08-07 – 2014-08-08 (×2): 1 via ORAL
  Filled 2014-08-06 (×2): qty 1

## 2014-08-06 MED ORDER — OXYCODONE-ACETAMINOPHEN 5-325 MG PO TABS: 1.0000 | ORAL_TABLET | ORAL | Status: DC | PRN

## 2014-08-06 MED ORDER — DIPHENHYDRAMINE HCL 50 MG/ML IJ SOLN: 12.5000 mg | INTRAMUSCULAR | Status: DC | PRN

## 2014-08-06 MED ORDER — ONDANSETRON HCL 4 MG/2ML IJ SOLN: 4.0000 mg | INTRAMUSCULAR | Status: DC | PRN

## 2014-08-06 MED ORDER — METHYLERGONOVINE MALEATE 0.2 MG PO TABS: 0.2000 mg | ORAL_TABLET | ORAL | Status: DC | PRN

## 2014-08-06 MED ORDER — DIPHENHYDRAMINE HCL 25 MG PO CAPS: 25.0000 mg | ORAL_CAPSULE | Freq: Four times a day (QID) | ORAL | Status: DC | PRN

## 2014-08-06 MED ORDER — PHENYLEPHRINE 40 MCG/ML (10ML) SYRINGE FOR IV PUSH (FOR BLOOD PRESSURE SUPPORT): 80.0000 ug | PREFILLED_SYRINGE | INTRAVENOUS | Status: DC | PRN

## 2014-08-06 MED ORDER — ACETAMINOPHEN 325 MG PO TABS: 650.0000 mg | ORAL_TABLET | ORAL | Status: DC | PRN

## 2014-08-06 MED ORDER — LACTATED RINGERS IV SOLN
INTRAVENOUS | Status: DC
  Administered 2014-08-06: 05:00:00 via INTRAVENOUS

## 2014-08-06 MED ORDER — BUTORPHANOL TARTRATE 1 MG/ML IJ SOLN: 1.0000 mg | INTRAMUSCULAR | Status: DC | PRN

## 2014-08-06 NOTE — Lactation Note (Signed)
This note was copied from the chart of [place]. Lactation Consultation Note  Initial visit made.  Baby is now 33 hours old.  Mom states baby has been latching easily and nursing well.  Baby just finished a feeding.  Mom states she breastfed her first baby for 13 months.  Basics reviewed and questions answered. Baby had a moderate spit of clear fluid when I was present.  Breastfeeding consultation services and support information given and reviewed.  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  Pump handout given for pumps available.  Encouraged to call for assist/concerns prn.  Patient Name: Laurie Mullins [id number] Date: 08/06/2014     Maternal Data    Feeding    LATCH Score/Interventions                      Lactation Tools Discussed/Used     Consult Status      Ave Filter 08/06/2014, 5:06 PM

## 2014-08-06 NOTE — Progress Notes (Signed)
Laurie Mullins is a 29 y.o. G2P1001 at [redacted]w[redacted]d by LMP admitted for active labor  Subjective: Patient feeling more pain  Objective: BP 106/62 mmHg  Pulse 70  Temp(Src) 98 F (36.7 C) (Oral)  Resp 18  Ht 5\' 8"  (1.727 m)  Wt 117.028 kg (258 lb)  BMI 39.24 kg/m2      FHT:  FHR: 140 bpm, variability: moderate,  accelerations:  Present,  decelerations:  Absent UC:   regular, every 3 minutes SVE:   Dilation: 6 Effacement (%): 90 Station: -2 Exam by:: Dr. Alwyn Pea  AROM clear  Labs: Lab Results  Component Value Date   WBC 12.6* 08/06/2014   HGB 9.6* 08/06/2014   HCT 31.6* 08/06/2014   MCV 68.4* 08/06/2014   PLT 257 08/06/2014    Assessment / Plan: Spontaneous labor, progressing normally  Labor: Progressing normally Preeclampsia:  no signs or symptoms of toxicity Fetal Wellbeing:  Category I Pain Control:  Labor support without medications I/D:  n/a Anticipated MOD:  NSVD  Terressa Evola STACIA 08/06/2014, 7:01 AM

## 2014-08-06 NOTE — Anesthesia Procedure Notes (Signed)
Epidural Patient location during procedure: OB Start time: 08/06/2014 8:09 AM  Staffing Anesthesiologist: Josephine Igo Performed by: anesthesiologist   Preanesthetic Checklist Completed: patient identified, site marked, surgical consent, pre-op evaluation, timeout performed, IV checked, risks and benefits discussed and monitors and equipment checked  Epidural Patient position: sitting Prep: site prepped and draped and DuraPrep Patient monitoring: continuous pulse ox and blood pressure Approach: midline Location: L3-L4 Injection technique: LOR air  Needle:  Needle type: Tuohy  Needle gauge: 17 G Needle length: 9 cm and 9 Needle insertion depth: 8 cm Catheter type: closed end flexible Catheter size: 19 Gauge Catheter at skin depth: 13 cm Test dose: negative and Other  Assessment Events: blood not aspirated, injection not painful, no injection resistance, negative IV test and no paresthesia  Additional Notes Patient identified. Risks and benefits discussed including failed block, incomplete  Pain control, post dural puncture headache, nerve damage, paralysis, blood pressure Changes, nausea, vomiting, reactions to medications-both toxic and allergic and post Partum back pain. All questions were answered. Patient expressed understanding and wished to proceed. Sterile technique was used throughout procedure. Epidural site was Dressed with sterile barrier dressing. No paresthesias, signs of intravascular injection Or signs of intrathecal spread were encountered.  Patient was more comfortable after the epidural was dosed. Please see RN's note for documentation of vital signs and FHR which are stable.\

## 2014-08-06 NOTE — MAU Note (Signed)
PT  SAYS SHE HURT BAD  SINCE 0115.   VE IN OFFICE ON  Tuesday  3  CM.   DENIES HSV AND MRSA.    GBS-POSITIVE.

## 2014-08-06 NOTE — H&P (Signed)
Laurie Mullins is a 29 y.o. female presenting for regular painful contractions.  Maternal Medical History:  Reason for admission: Contractions.  Nausea.  Contractions: Onset was 6-12 hours ago.   Frequency: regular.   Duration is approximately 60 seconds.   Perceived severity is moderate.    Fetal activity: Perceived fetal activity is normal.   Last perceived fetal movement was within the past 12 hours.    Prenatal complications: no prenatal complications Prenatal Complications - Diabetes: none.    OB History    Gravida Para Term Preterm AB TAB SAB Ectopic Multiple Living   2 1 1  0 0 0 0 0 0 1     Past Medical History  Diagnosis Date  . History of chicken pox    Past Surgical History  Procedure Laterality Date  . Wisdom tooth extraction    . Wisdom tooth extraction    . Dilation and evacuation Bilateral 03/26/2013    Procedure: DILATATION AND EVACUATION;  Surgeon: Daria Pastures, MD;  Location: [facility];  Service: Gynecology;  Laterality: Bilateral;   Family History: family history includes COPD in her paternal grandmother; Diabetes in her paternal grandmother; Heart attack in her maternal grandfather and maternal grandmother. Social History:  reports that she has never smoked. She has never used smokeless tobacco. She reports that she does not drink alcohol or use illicit drugs.   Prenatal Transfer Tool  Maternal Diabetes: No Genetic Screening: Declined Maternal Ultrasounds/Referrals: Normal Fetal Ultrasounds or other Referrals:  Other:  normal anatomy scan Maternal Substance Abuse:  No Significant Maternal Medications:  None Significant Maternal Lab Results:  Lab values include: Group B Strep positive Other Comments:  None  Review of Systems  Constitutional: Negative.  Negative for fever and chills.  HENT: Negative for hearing loss.   Eyes: Negative for blurred vision and double vision.  Respiratory: Negative for cough.   Cardiovascular: Negative for chest  pain and palpitations.  Gastrointestinal: Negative for heartburn and nausea.  Skin: Negative for rash.  Neurological: Negative for dizziness, tingling and headaches.  All other systems reviewed and are negative.   Dilation: 5 Effacement (%): 90 Station: -2 Exam by:: DCALLAWAY, RN Height 5\' 8"  (1.727 m), weight 117.028 kg (258 lb). Maternal Exam:  Uterine Assessment: Contraction strength is moderate.  Contraction duration is 60 seconds. Contraction frequency is regular.   Abdomen: Fundal height is 40 cm.   Estimated fetal weight is 3200 grams.   Fetal presentation: vertex  Introitus: Normal vulva. Normal vagina.  Ferning test: not done.  Nitrazine test: not done. Amniotic fluid character: not assessed.  Pelvis: adequate for delivery.   Cervix: Cervix evaluated by digital exam.   5/90/-2  Fetal Exam Fetal Monitor Review: Baseline rate: 145.  Variability: moderate (6-25 bpm).   Pattern: accelerations present.    Fetal State Assessment: Category I - tracings are normal.     Physical Exam  Vitals reviewed. Constitutional: She is oriented to person, place, and time. She appears well-developed and well-nourished.  HENT:  Head: Normocephalic and atraumatic.  Eyes: Pupils are equal, round, and reactive to light.  Neck: Normal range of motion.  Cardiovascular: Normal rate and regular rhythm.   Respiratory: Effort normal.  GI: Soft.  Genitourinary: Vagina normal.  Musculoskeletal: Normal range of motion.  Neurological: She is alert and oriented to person, place, and time. She has normal reflexes.  Skin: Skin is warm.    Prenatal labs: ABO, Rh:  A pos Antibody:  neg Rubella:  Immune RPR:  NR HBsAg:   Neg HIV:   Neg GBS:   POS  Assessment/Plan: 29 yo G3P1 at 40 weeks 2 days presents in active labor Admit to L&D One episode of deceleration in MAU nadir 100's lasting 3 minutes with return to baseline. Moderate variability throughout.  Will monitor closely Epidural  on demand PCN G for GBS   Jacobb Alen STACIA 08/06/2014, 4:02 AM

## 2014-08-06 NOTE — Anesthesia Preprocedure Evaluation (Signed)
Anesthesia Evaluation  Patient identified by MRN, date of birth, ID band Patient awake    Reviewed: Allergy & Precautions, H&P , Patient's Chart, lab work & pertinent test results  Airway Mallampati: III  TM Distance: >3 FB Neck ROM: full    Dental no notable dental hx. (+) Teeth Intact   Pulmonary neg pulmonary ROS,  breath sounds clear to auscultation  Pulmonary exam normal       Cardiovascular negative cardio ROS Normal cardiovascular examRhythm:regular Rate:Normal     Neuro/Psych negative neurological ROS  negative psych ROS   GI/Hepatic negative GI ROS, Neg liver ROS,   Endo/Other  Morbid obesity  Renal/GU negative Renal ROS  negative genitourinary   Musculoskeletal   Abdominal   Peds  Hematology  (+) anemia ,   Anesthesia Other Findings   Reproductive/Obstetrics (+) Pregnancy                             Anesthesia Physical Anesthesia Plan  ASA: III  Anesthesia Plan: Epidural   Post-op Pain Management:    Induction:   Airway Management Planned:   Additional Equipment:   Intra-op Plan:   Post-operative Plan:   Informed Consent: I have reviewed the patients History and Physical, chart, labs and discussed the procedure including the risks, benefits and alternatives for the proposed anesthesia with the patient or authorized representative who has indicated his/her understanding and acceptance.     Plan Discussed with: Anesthesiologist  Anesthesia Plan Comments:         Anesthesia Quick Evaluation

## 2014-08-06 NOTE — Anesthesia Postprocedure Evaluation (Signed)
  Anesthesia Post-op Note  Patient: Laurie Mullins  Procedure(s) Performed: * No procedures listed *  Patient Location: Mother/Baby  Anesthesia Type:Epidural  Level of Consciousness: awake  Airway and Oxygen Therapy: Patient Spontanous Breathing  Post-op Pain: mild  Post-op Assessment: Patient's Cardiovascular Status Stable and Respiratory Function Stable  Post-op Vital Signs: stable  Last Vitals:  Filed Vitals:   08/06/14 1500  BP: 129/62  Pulse: 86  Temp: 36.9 C  Resp: 20    Complications: No apparent anesthesia complications

## 2014-08-07 LAB — CBC
HEMATOCRIT: 27.9 % — AB (ref 36.0–46.0)
Hemoglobin: 8.5 g/dL — ABNORMAL LOW (ref 12.0–15.0)
MCH: 21 pg — ABNORMAL LOW (ref 26.0–34.0)
MCHC: 30.5 g/dL (ref 30.0–36.0)
MCV: 69.1 fL — ABNORMAL LOW (ref 78.0–100.0)
PLATELETS: 205 10*3/uL (ref 150–400)
RBC: 4.04 MIL/uL (ref 3.87–5.11)
RDW: 17.1 % — AB (ref 11.5–15.5)
WBC: 12.2 10*3/uL — ABNORMAL HIGH (ref 4.0–10.5)

## 2014-08-07 MED ORDER — IBUPROFEN 600 MG PO TABS: 600.0000 mg | ORAL_TABLET | Freq: Four times a day (QID) | ORAL | Status: DC | PRN

## 2014-08-07 MED ORDER — OXYCODONE-ACETAMINOPHEN 5-325 MG PO TABS: 2.0000 | ORAL_TABLET | ORAL | Status: DC | PRN

## 2014-08-07 MED ORDER — DOCUSATE SODIUM 100 MG PO CAPS: 100.0000 mg | ORAL_CAPSULE | Freq: Two times a day (BID) | ORAL | Status: DC

## 2014-08-07 NOTE — Discharge Instructions (Signed)
Breastfeeding Deciding to breastfeed is one of the best choices you can make for you and your baby. A change in hormones during pregnancy causes your breast tissue to grow and increases the number and size of your milk ducts. These hormones also allow proteins, sugars, and fats from your blood supply to make breast milk in your milk-producing glands. Hormones prevent breast milk from being released before your baby is born as well as prompt milk flow after birth. Once breastfeeding has begun, thoughts of your baby, as well as his or her sucking or crying, can stimulate the release of milk from your milk-producing glands.  BENEFITS OF BREASTFEEDING For Your Baby  Your first milk (colostrum) helps your baby's digestive system function better.   There are antibodies in your milk that help your baby fight off infections.   Your baby has a lower incidence of asthma, allergies, and sudden infant death syndrome.   The nutrients in breast milk are better for your baby than infant formulas and are designed uniquely for your baby's needs.   Breast milk improves your baby's brain development.   Your baby is less likely to develop other conditions, such as childhood obesity, asthma, or type 2 diabetes mellitus.  For You   Breastfeeding helps to create a very special bond between you and your baby.   Breastfeeding is convenient. Breast milk is always available at the correct temperature and costs nothing.   Breastfeeding helps to burn calories and helps you lose the weight gained during pregnancy.   Breastfeeding makes your uterus contract to its prepregnancy size faster and slows bleeding (lochia) after you give birth.   Breastfeeding helps to lower your risk of developing type 2 diabetes mellitus, osteoporosis, and breast or ovarian cancer later in life. SIGNS THAT YOUR BABY IS HUNGRY Early Signs of Hunger  Increased alertness or activity.  Stretching.  Movement of the head from  side to side.  Movement of the head and opening of the mouth when the corner of the mouth or cheek is stroked (rooting).  Increased sucking sounds, smacking lips, cooing, sighing, or squeaking.  Hand-to-mouth movements.  Increased sucking of fingers or hands. Late Signs of Hunger  Fussing.  Intermittent crying. Extreme Signs of Hunger Signs of extreme hunger will require calming and consoling before your baby will be able to breastfeed successfully. Do not wait for the following signs of extreme hunger to occur before you initiate breastfeeding:   Restlessness.  A loud, strong cry.   Screaming. BREASTFEEDING BASICS Breastfeeding Initiation  Find a comfortable place to sit or lie down, with your neck and back well supported.  Place a pillow or rolled up blanket under your baby to bring him or her to the level of your breast (if you are seated). Nursing pillows are specially designed to help support your arms and your baby while you breastfeed.  Make sure that your baby's abdomen is facing your abdomen.   Gently massage your breast. With your fingertips, massage from your chest wall toward your nipple in a circular motion. This encourages milk flow. You may need to continue this action during the feeding if your milk flows slowly.  Support your breast with 4 fingers underneath and your thumb above your nipple. Make sure your fingers are well away from your nipple and your baby's mouth.   Stroke your baby's lips gently with your finger or nipple.   When your baby's mouth is open wide enough, quickly bring your baby to your  breast, placing your entire nipple and as much of the colored area around your nipple (areola) as possible into your baby's mouth.   More areola should be visible above your baby's upper lip than below the lower lip.   Your baby's tongue should be between his or her lower gum and your breast.   Ensure that your baby's mouth is correctly positioned  around your nipple (latched). Your baby's lips should create a seal on your breast and be turned out (everted).  It is common for your baby to suck about 2-3 minutes in order to start the flow of breast milk. Latching Teaching your baby how to latch on to your breast properly is very important. An improper latch can cause nipple pain and decreased milk supply for you and poor weight gain in your baby. Also, if your baby is not latched onto your nipple properly, he or she may swallow some air during feeding. This can make your baby fussy. Burping your baby when you switch breasts during the feeding can help to get rid of the air. However, teaching your baby to latch on properly is still the best way to prevent fussiness from swallowing air while breastfeeding. Signs that your baby has successfully latched on to your nipple:    Silent tugging or silent sucking, without causing you pain.   Swallowing heard between every 3-4 sucks.    Muscle movement above and in front of his or her ears while sucking.  Signs that your baby has not successfully latched on to nipple:   Sucking sounds or smacking sounds from your baby while breastfeeding.  Nipple pain. If you think your baby has not latched on correctly, slip your finger into the corner of your baby's mouth to break the suction and place it between your baby's gums. Attempt breastfeeding initiation again. Signs of Successful Breastfeeding Signs from your baby:   A gradual decrease in the number of sucks or complete cessation of sucking.   Falling asleep.   Relaxation of his or her body.   Retention of a small amount of milk in his or her mouth.   Letting go of your breast by himself or herself. Signs from you:  Breasts that have increased in firmness, weight, and size 1-3 hours after feeding.   Breasts that are softer immediately after breastfeeding.  Increased milk volume, as well as a change in milk consistency and color by  the fifth day of breastfeeding.   Nipples that are not sore, cracked, or bleeding. Signs That Your [employer]el Books is Getting Enough Milk  Wetting at least 3 diapers in a 24-hour period. The urine should be clear and pale yellow by age 38 days.  At least 3 stools in a 24-hour period by age 38 days. The stool should be soft and yellow.  At least 3 stools in a 24-hour period by age 86 days. The stool should be seedy and yellow.  No loss of weight greater than 10% of birth weight during the first 14 days of age.  Average weight gain of 4-7 ounces (113-198 g) per week after age 76 days.  Consistent daily weight gain by age 68 days, without weight loss after the age of 2 weeks. After a feeding, your baby may spit up a small amount. This is common. BREASTFEEDING FREQUENCY AND DURATION Frequent feeding will help you make more milk and can prevent sore nipples and breast engorgement. Breastfeed when you feel the need to reduce the fullness of your breasts  or when your baby shows signs of hunger. This is called "breastfeeding on demand." Avoid introducing a pacifier to your baby while you are working to establish breastfeeding (the first 4-6 weeks after your baby is born). After this time you may choose to use a pacifier. Research has shown that pacifier use during the first year of a baby's life decreases the risk of sudden infant death syndrome (SIDS). Allow your baby to feed on each breast as long as he or she wants. Breastfeed until your baby is finished feeding. When your baby unlatches or falls asleep while feeding from the first breast, offer the second breast. Because newborns are often sleepy in the first few weeks of life, you may need to awaken your baby to get him or her to feed. Breastfeeding times will vary from baby to baby. However, the following rules can serve as a guide to help you ensure that your baby is properly fed:  Newborns (babies 21 weeks of age or younger) may breastfeed every 1-3  hours.  Newborns should not go longer than 3 hours during the day or 5 hours during the night without breastfeeding.  You should breastfeed your baby a minimum of 8 times in a 24-hour period until you begin to introduce solid foods to your baby at around 87 months of age. BREAST MILK PUMPING Pumping and storing breast milk allows you to ensure that your baby is exclusively fed your breast milk, even at times when you are unable to breastfeed. This is especially important if you are going back to work while you are still breastfeeding or when you are not able to be present during feedings. Your lactation consultant can give you guidelines on how long it is safe to store breast milk.  A breast pump is a machine that allows you to pump milk from your breast into a sterile bottle. The pumped breast milk can then be stored in a refrigerator or freezer. Some breast pumps are operated by hand, while others use electricity. Ask your lactation consultant which type will work best for you. Breast pumps can be purchased, but some hospitals and breastfeeding support groups lease breast pumps on a monthly basis. A lactation consultant can teach you how to hand express breast milk, if you prefer not to use a pump.  CARING FOR YOUR BREASTS WHILE YOU BREASTFEED Nipples can become dry, cracked, and sore while breastfeeding. The following recommendations can help keep your breasts moisturized and healthy:  Avoid using soap on your nipples.   Wear a supportive bra. Although not required, special nursing bras and tank tops are designed to allow access to your breasts for breastfeeding without taking off your entire bra or top. Avoid wearing underwire-style bras or extremely tight bras.  Air dry your nipples for 3-70minutes after each feeding.   Use only cotton bra pads to absorb leaked breast milk. Leaking of breast milk between feedings is normal.   Use lanolin on your nipples after breastfeeding. Lanolin helps to  maintain your skin's normal moisture barrier. If you use pure lanolin, you do not need to wash it off before feeding your baby again. Pure lanolin is not toxic to your baby. You may also hand express a few drops of breast milk and gently massage that milk into your nipples and allow the milk to air dry. In the first few weeks after giving birth, some women experience extremely full breasts (engorgement). Engorgement can make your breasts feel heavy, warm, and tender to the  touch. Engorgement peaks within 3-5 days after you give birth. The following recommendations can help ease engorgement:  Completely empty your breasts while breastfeeding or pumping. You may want to start by applying warm, moist heat (in the shower or with warm water-soaked hand towels) just before feeding or pumping. This increases circulation and helps the milk flow. If your baby does not completely empty your breasts while breastfeeding, pump any extra milk after he or she is finished.  Wear a snug bra (nursing or regular) or tank top for 1-2 days to signal your body to slightly decrease milk production.  Apply ice packs to your breasts, unless this is too uncomfortable for you.  Make sure that your baby is latched on and positioned properly while breastfeeding. If engorgement persists after 48 hours of following these recommendations, contact your health care provider or a Science writer. OVERALL HEALTH CARE RECOMMENDATIONS WHILE BREASTFEEDING  Eat healthy foods. Alternate between meals and snacks, eating 3 of each per day. Because what you eat affects your breast milk, some of the foods may make your baby more irritable than usual. Avoid eating these foods if you are sure that they are negatively affecting your baby.  Drink milk, fruit juice, and water to satisfy your thirst (about 10 glasses a day).   Rest often, relax, and continue to take your prenatal vitamins to prevent fatigue, stress, and anemia.  Continue  breast self-awareness checks.  Avoid chewing and smoking tobacco.  Avoid alcohol and drug use. Some medicines that may be harmful to your baby can pass through breast milk. It is important to ask your health care provider before taking any medicine, including all over-the-counter and prescription medicine as well as vitamin and herbal supplements. It is possible to become pregnant while breastfeeding. If birth control is desired, ask your health care provider about options that will be safe for your baby. SEEK MEDICAL CARE IF:   You feel like you want to stop breastfeeding or have become frustrated with breastfeeding.  You have painful breasts or nipples.  Your nipples are cracked or bleeding.  Your breasts are red, tender, or warm.  You have a swollen area on either breast.  You have a fever or chills.  You have nausea or vomiting.  You have drainage other than breast milk from your nipples.  Your breasts do not become full before feedings by the fifth day after you give birth.  You feel sad and depressed.  Your baby is too sleepy to eat well.  Your baby is having trouble sleeping.   Your baby is wetting less than 3 diapers in a 24-hour period.  Your baby has less than 3 stools in a 24-hour period.  Your baby's skin or the white part of his or her eyes becomes yellow.   Your baby is not gaining weight by 64 days of age. SEEK IMMEDIATE MEDICAL CARE IF:   Your baby is overly tired (lethargic) and does not want to wake up and feed.  Your baby develops an unexplained fever. Document Released: 02/26/2005 Document Revised: 03/03/2013 Document Reviewed: 08/20/2012 [facility] Patient Information 2015 [place], [place]. This information is not intended to replace advice given to you by your health care provider. Make sure you discuss any questions you have with your health care provider.

## 2014-08-07 NOTE — Discharge Summary (Signed)
Obstetric Discharge Summary Reason for Admission: onset of labor Prenatal Procedures: NST and ultrasound Intrapartum Procedures: spontaneous vaginal delivery Postpartum Procedures: none Complications-Operative and Postpartum: none HEMOGLOBIN  Date Value Ref Range Status  08/07/2014 8.5* 12.0 - 15.0 g/dL Final   HCT  Date Value Ref Range Status  08/07/2014 27.9* 36.0 - 46.0 % Final    Physical Exam:  General: alert, cooperative and appears stated age 29: appropriate Uterine Fundus: firm Incision: healing well DVT Evaluation: No evidence of DVT seen on physical exam.  Discharge Diagnoses: Term Pregnancy-delivered  Discharge Information: Date: 08/07/2014 Activity: pelvic rest Diet: routine Medications: Ibuprofen, Colace and Percocet Condition: improved Instructions: refer to practice specific booklet Discharge to: home Follow-up Information    Follow up with Marcial Pacas., MD In 4 weeks.   Specialty:  Obstetrics and Gynecology   Why:  For postpartum evaluation   Contact information:   [address] [place] [address] [phone]       Newborn Data: Live born female  Birth Weight: 9 lb 8.7 oz (4330 g) APGAR: 9, 9  Home with mother.  Brigette Hopfer H. 08/07/2014, 10:07 AM

## 2014-08-08 NOTE — Lactation Note (Signed)
This note was copied from the chart of [place]. Lactation Consultation Note  Patient Name: Laurie Mullins [id number] Date: 08/08/2014 Reason for consult: Follow-up assessment  2nd visit to see mom , early baby had recently fed prior to [facility] visit , 2nd visit baby was circ'd  And sleepy . LC reviewed sore nipple and engorgement prevention and tx . Referring to pages 24 -25p Baby and me booklet. 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 Farrell made mom aware and she will call this week. Per mom has a back up at home. Mother informed of post-discharge support and given phone number to the lactation department, including services for phone call assistance; out-patient appointments; and breastfeeding support group. List of other breastfeeding resources in the community given in the handout. Encouraged mother to call for problems or concerns related to breastfeeding.   Maternal Data    Feeding Feeding Type:  (per mom recently attempted , sleepy from circ )  LATCH Score/Interventions Latch: Grasps breast easily, tongue down, lips flanged, rhythmical sucking.  Audible Swallowing: Spontaneous and intermittent  Type of Nipple: Everted at rest and after stimulation  Comfort (Breast/Nipple): Soft / non-tender     Hold (Positioning): No assistance needed to correctly position infant at breast. Intervention(s): Breastfeeding basics reviewed  LATCH Score: 10  Lactation Tools Discussed/Used WIC Program: No Pump Review: Milk Storage   Consult Status Consult Status: Complete    Myer Haff 08/08/2014, 1:07 PM

## 2016-08-10 LAB — HM PAP SMEAR

## 2016-09-04 DIAGNOSIS — Z124 Encounter for screening for malignant neoplasm of cervix: Secondary | ICD-10-CM | POA: Diagnosis not present

## 2016-09-04 DIAGNOSIS — Z01419 Encounter for gynecological examination (general) (routine) without abnormal findings: Secondary | ICD-10-CM | POA: Diagnosis not present

## 2016-09-04 LAB — RESULTS CONSOLE HPV: CHL HPV: NEGATIVE

## 2016-09-04 LAB — HM PAP SMEAR

## 2017-11-29 ENCOUNTER — Encounter: Payer: Self-pay | Admitting: Family Medicine

## 2017-11-29 ENCOUNTER — Other Ambulatory Visit: Payer: Self-pay

## 2017-11-29 ENCOUNTER — Ambulatory Visit: Payer: 59 | Admitting: Family Medicine

## 2017-11-29 VITALS — BP 110/72 | HR 77 | Temp 98.2°F | Ht 68.5 in | Wt 208.2 lb

## 2017-11-29 DIAGNOSIS — Z0001 Encounter for general adult medical examination with abnormal findings: Secondary | ICD-10-CM | POA: Diagnosis not present

## 2017-11-29 DIAGNOSIS — Z0000 Encounter for general adult medical examination without abnormal findings: Secondary | ICD-10-CM

## 2017-11-29 DIAGNOSIS — L700 Acne vulgaris: Secondary | ICD-10-CM | POA: Diagnosis not present

## 2017-11-29 HISTORY — DX: Acne vulgaris: L70.0

## 2017-11-29 LAB — CBC WITH DIFFERENTIAL/PLATELET
BASOS ABS: 0 10*3/uL (ref 0.0–0.1)
Basophils Relative: 0.6 % (ref 0.0–3.0)
EOS ABS: 0.1 10*3/uL (ref 0.0–0.7)
Eosinophils Relative: 1.3 % (ref 0.0–5.0)
HCT: 42.3 % (ref 36.0–46.0)
HEMOGLOBIN: 14.2 g/dL (ref 12.0–15.0)
Lymphocytes Relative: 35.7 % (ref 12.0–46.0)
Lymphs Abs: 2.3 10*3/uL (ref 0.7–4.0)
MCHC: 33.6 g/dL (ref 30.0–36.0)
MCV: 83.2 fl (ref 78.0–100.0)
Monocytes Absolute: 0.3 10*3/uL (ref 0.1–1.0)
Monocytes Relative: 5.2 % (ref 3.0–12.0)
Neutro Abs: 3.6 10*3/uL (ref 1.4–7.7)
Neutrophils Relative %: 57.2 % (ref 43.0–77.0)
PLATELETS: 223 10*3/uL (ref 150.0–400.0)
RBC: 5.09 Mil/uL (ref 3.87–5.11)
RDW: 13.4 % (ref 11.5–15.5)
WBC: 6.3 10*3/uL (ref 4.0–10.5)

## 2017-11-29 LAB — COMPREHENSIVE METABOLIC PANEL
ALBUMIN: 4.5 g/dL (ref 3.5–5.2)
ALT: 11 U/L (ref 0–35)
AST: 16 U/L (ref 0–37)
Alkaline Phosphatase: 68 U/L (ref 39–117)
BILIRUBIN TOTAL: 0.6 mg/dL (ref 0.2–1.2)
BUN: 13 mg/dL (ref 6–23)
CALCIUM: 9.3 mg/dL (ref 8.4–10.5)
CO2: 29 meq/L (ref 19–32)
CREATININE: 0.74 mg/dL (ref 0.40–1.20)
Chloride: 103 mEq/L (ref 96–112)
GFR: 95.82 mL/min (ref >60.00)
Glucose, Bld: 81 mg/dL (ref 70–99)
Potassium: 4.5 mEq/L (ref 3.5–5.1)
Sodium: 140 mEq/L (ref 135–145)
Total Protein: 7.5 g/dL (ref 6.0–8.3)

## 2017-11-29 LAB — LIPID PANEL
Cholesterol: 214 mg/dL — ABNORMAL HIGH (ref 0–200)
HDL: 55.7 mg/dL (ref >39.00)
LDL Cholesterol: 148 mg/dL — ABNORMAL HIGH (ref 0–99)
NonHDL: 157.96
TRIGLYCERIDES: 52 mg/dL (ref 0.0–149.0)
Total CHOL/HDL Ratio: 4
VLDL: 10.4 mg/dL (ref 0.0–40.0)

## 2017-11-29 MED ORDER — ADAPALENE 0.1 % EX CREA
TOPICAL_CREAM | Freq: Every day | CUTANEOUS | 0 refills | Status: DC
Start: 2017-11-29 — End: 2018-03-26

## 2017-11-29 MED ORDER — DOXYCYCLINE HYCLATE 100 MG PO TABS: 100.0000 mg | ORAL_TABLET | Freq: Two times a day (BID) | ORAL | 0 refills | Status: AC

## 2017-11-29 NOTE — Patient Instructions (Signed)
Please return in 12 months for your annual complete physical; please come fasting.   Discuss with the dermatologist about Mirena IUD or Nexplanon (progesterone only contraceptions) to ensure it isn't likely to worsen your acne.   It was a pleasure meeting you today! Thank you for choosing [place] to meet your healthcare needs! I truly look forward to working with you. If you have any questions or concerns, please send me a message via Mychart or call the office at [phone].  Please do these things to maintain good health!   Exercise at least 30-45 minutes a day,  4-5 days a week.   Eat a low-fat diet with lots of fruits and vegetables, up to 7-9 servings per day.  Drink plenty of water daily. Try to drink 8 8oz glasses per day.  Seatbelts can save your life. Always wear your seatbelt.  Place Smoke Detectors on every level of your home and check batteries every year.  Schedule an appointment with an eye doctor for an eye exam every 1-2 years  Safe sex - use condoms to protect yourself from STDs if you could be exposed to these types of infections. Use birth control if you do not want to become pregnant and are sexually active.  Avoid heavy alcohol use. If you drink, keep it to less than 2 drinks/day and not every day.  [place].  Choose someone you trust that could speak for you if you became unable to speak for yourself.  Depression is common in our stressful world.If you're feeling down or losing interest in things you normally enjoy, please come in for a visit.  If anyone is threatening or hurting you, please get help. Physical or Emotional Violence is never OK.

## 2017-11-29 NOTE — Progress Notes (Signed)
Subjective  Chief Complaint  Patient presents with  . Establish Care    Transfer from [facility], last seen 7 years ago, sees Dr. Philis Pique for GYN care last seen 08/2016  . Acne    cystic acne during time of cycle   . Annual Exam    patient is fasting     HPI: Laurie Mullins is a 32 y.o. female who presents to [place] at [facility] today for a Female Wellness Visit.   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  Overall very healthy.  Recently, suffering from cystic acne related to her menstrual cycles.  Suffered from acne but never this severe.  He does have an appointment dermatology at the end of next month.  She attributes the change to a change in her daily routine due to product change.  Health maintenance: Had Pap smear with negative HPV screening June 2018.  See lab report.  Immunizations up-to-date.  Discussed birth control options.  Patient using condoms.  Not certain if she wants any more children.  Has used birth control pills successfully in the past but would prefer not to take a pill.  Would consider IUD or Nexplanon.  Assessment  1. Annual physical exam   2. Cystic acne      Plan  Female Wellness Visit:  Age appropriate Health Maintenance and Prevention measures were discussed with patient. Included topics are cancer screening recommendations, ways to keep healthy (see AVS) including dietary and exercise recommendations, regular eye and dental care, use of seat belts, and avoidance of moderate alcohol use and tobacco use.   BMI: discussed patient's BMI and encouraged positive lifestyle modifications to help get to or maintain a target BMI.  HM needs and immunizations were addressed and ordered. See below for orders. See HM and immunization section for updates.  Routine labs  and screening tests ordered including cmp, cbc and lipids where appropriate.  Discussed recommendations regarding Vit D and calcium supplementation (see AVS)  Acne: Start doxycycline for 1 week and then add Differin.  Follow-up with dermatology for acne management and also for skin cancer screening.  Patient with multiple moles.  No family history  Patient to think about her contraceptive options and return if she would like to proceed with contraception.  Follow up: Annual exam  Orders Placed This Encounter  Procedures  . CBC with Differential/Platelet  . Comprehensive metabolic panel  . Lipid panel  . HM PAP SMEAR   Meds ordered this encounter  Medications  . doxycycline (VIBRA-TABS) 100 MG tablet    Sig: Take 1 tablet (100 mg total) by mouth 2 (two) times daily for 7 days.    Dispense:  14 tablet    Refill:  0  . adapalene (DIFFERIN) 0.1 % cream    Sig: Apply topically at bedtime.    Dispense:  45 g    Refill:  0      Lifestyle: Body mass index is 31.2 kg/m. Wt Readings from Last 3 Encounters:  11/29/17 208 lb 3.2 oz (94.4 kg)  08/06/14 258 lb (117 kg)  03/26/13 226 lb (102.5 kg)   Diet: General, but working on losing weight by improving diet Exercise: occasionally,  Need for contraception: Yes, condoms  Patient Active Problem List   Diagnosis Date Noted  . Cystic acne 11/29/2017  Health Maintenance  Topic Date Due  . PAP SMEAR  08/10/2021  . TETANUS/TDAP  06/06/2024  . INFLUENZA VACCINE  Completed  . HIV Screening  Completed   Immunization History  Administered Date(s) Administered  . Influenza-Unspecified 12/10/2013  . Tdap 06/07/2014   We updated and reviewed the patient's past history in detail and it is documented below. Allergies: Patient has No Known Allergies. Past Medical History Patient  has a past medical history of Cystic acne (11/29/2017) and History of chicken pox. Past Surgical History Patient  has a past surgical history that includes  Wisdom tooth extraction; Wisdom tooth extraction; and Dilation and evacuation (Bilateral, 03/26/2013). Family History: Patient family history includes COPD in her paternal grandmother; Cancer in her paternal grandfather and paternal grandmother; Diabetes in her paternal grandmother; Healthy in her son and son; Heart attack in her maternal grandfather and maternal grandmother; Heart disease in her maternal grandfather and paternal grandmother; Hyperlipidemia in her maternal grandmother; Hypertension in her mother; Kidney Stones in her mother. Social History:  Patient  reports that she has never smoked. She has never used smokeless tobacco. She reports that she does not drink alcohol or use drugs.  Review of Systems: Constitutional: negative for fever or malaise Ophthalmic: negative for photophobia, double vision or loss of vision Cardiovascular: negative for chest pain, dyspnea on exertion, or new LE swelling Respiratory: negative for SOB or persistent cough Gastrointestinal: negative for abdominal pain, change in bowel habits or melena Genitourinary: negative for dysuria or gross hematuria, no abnormal uterine bleeding or disharge Musculoskeletal: negative for new gait disturbance or muscular weakness Integumentary: negative for new or persistent rashes, no breast lumps Neurological: negative for TIA or stroke symptoms Psychiatric: negative for SI or delusions Allergic/Immunologic: negative for hives Patient Care Team    Relationship Specialty Notifications Start End  Leamon Arnt, MD PCP - General Family Medicine  11/29/17     Objective  Vitals: BP 110/72   Pulse 77   Temp 98.2 F (36.8 C)   Ht 5' 8.5" (1.74 m)   Wt 208 lb 3.2 oz (94.4 kg)   LMP 11/11/2017   SpO2 98%   BMI 31.20 kg/m  General:  Well developed, well nourished, no acute distress  Psych:  Alert and orientedx3,normal mood and affect HEENT:  Normocephalic, atraumatic, non-icteric sclera, PERRL, oropharynx is clear  without mass or exudate, supple neck without adenopathy, mass or thyromegaly Cardiovascular:  Normal S1, S2, RRR without gallop, rub or murmur, nondisplaced PMI Respiratory:  Good breath sounds bilaterally, CTAB with normal respiratory effort Gastrointestinal: normal bowel sounds, soft, non-tender, no noted masses. No HSM MSK: no deformities, contusions. Joints are without erythema or swelling. Spine and CVA region are nontender Skin:  Warm, no rashes or suspicious lesions noted, multiple moles, some with irregular borders face with cystic acne, dry skin Neurologic:    Mental status is normal. CN 2-11 are normal. Gross motor and sensory exams are normal. Normal gait. No tremor Breast Exam: No mass, skin retraction or nipple discharge is appreciated in either breast. No axillary adenopathy. Fibrocystic changes are not noted    Commons side effects, risks, benefits, and alternatives for medications and treatment plan prescribed today were discussed, and the patient expressed understanding of the given instructions. Patient is instructed to call or message via MyChart if he/she has any questions or concerns regarding our treatment plan. No barriers to understanding were identified. We discussed Red Flag symptoms and signs in detail. Patient expressed understanding regarding what to  do in case of urgent or emergency type symptoms.   Medication list was reconciled, printed and provided to the patient in AVS. Patient instructions and summary information was reviewed with the patient as documented in the AVS. This note was prepared with assistance of Dragon voice recognition software. Occasional wrong-word or sound-a-like substitutions may have occurred due to the inherent limitations of voice recognition software

## 2017-12-17 ENCOUNTER — Encounter: Payer: Self-pay | Admitting: Family Medicine

## 2017-12-18 MED ORDER — DOXYCYCLINE HYCLATE 100 MG PO TABS: 100.0000 mg | ORAL_TABLET | Freq: Two times a day (BID) | ORAL | 0 refills | Status: AC

## 2018-01-01 ENCOUNTER — Other Ambulatory Visit: Payer: Self-pay

## 2018-01-01 DIAGNOSIS — D0359 Melanoma in situ of other part of trunk: Secondary | ICD-10-CM | POA: Diagnosis not present

## 2018-01-01 DIAGNOSIS — C439 Malignant melanoma of skin, unspecified: Secondary | ICD-10-CM

## 2018-01-01 DIAGNOSIS — D2271 Melanocytic nevi of right lower limb, including hip: Secondary | ICD-10-CM | POA: Diagnosis not present

## 2018-01-01 DIAGNOSIS — D229 Melanocytic nevi, unspecified: Secondary | ICD-10-CM

## 2018-01-01 DIAGNOSIS — D225 Melanocytic nevi of trunk: Secondary | ICD-10-CM | POA: Diagnosis not present

## 2018-01-01 HISTORY — DX: Melanocytic nevi, unspecified: D22.9

## 2018-01-01 HISTORY — DX: Malignant melanoma of skin, unspecified: C43.9

## 2018-01-02 DIAGNOSIS — D485 Neoplasm of uncertain behavior of skin: Secondary | ICD-10-CM | POA: Diagnosis not present

## 2018-01-02 DIAGNOSIS — C4359 Malignant melanoma of other part of trunk: Secondary | ICD-10-CM | POA: Diagnosis not present

## 2018-01-02 DIAGNOSIS — L700 Acne vulgaris: Secondary | ICD-10-CM | POA: Diagnosis not present

## 2018-01-28 DIAGNOSIS — Z30430 Encounter for insertion of intrauterine contraceptive device: Secondary | ICD-10-CM | POA: Diagnosis not present

## 2018-01-28 DIAGNOSIS — Z3202 Encounter for pregnancy test, result negative: Secondary | ICD-10-CM | POA: Diagnosis not present

## 2018-02-04 ENCOUNTER — Other Ambulatory Visit: Payer: Self-pay | Admitting: Dermatology

## 2018-02-04 DIAGNOSIS — C4359 Malignant melanoma of other part of trunk: Secondary | ICD-10-CM | POA: Diagnosis not present

## 2018-02-13 DIAGNOSIS — Z5181 Encounter for therapeutic drug level monitoring: Secondary | ICD-10-CM | POA: Diagnosis not present

## 2018-02-13 DIAGNOSIS — L709 Acne, unspecified: Secondary | ICD-10-CM | POA: Diagnosis not present

## 2018-03-17 DIAGNOSIS — K130 Diseases of lips: Secondary | ICD-10-CM | POA: Diagnosis not present

## 2018-03-17 DIAGNOSIS — Z5181 Encounter for therapeutic drug level monitoring: Secondary | ICD-10-CM | POA: Diagnosis not present

## 2018-03-17 DIAGNOSIS — L700 Acne vulgaris: Secondary | ICD-10-CM | POA: Diagnosis not present

## 2018-03-26 ENCOUNTER — Other Ambulatory Visit: Payer: Self-pay

## 2018-03-26 ENCOUNTER — Ambulatory Visit: Payer: 59 | Admitting: Family Medicine

## 2018-03-26 ENCOUNTER — Encounter: Payer: Self-pay | Admitting: Family Medicine

## 2018-03-26 VITALS — BP 116/70 | HR 90 | Temp 98.1°F | Resp 16 | Ht 68.5 in | Wt 206.8 lb

## 2018-03-26 DIAGNOSIS — B9789 Other viral agents as the cause of diseases classified elsewhere: Secondary | ICD-10-CM

## 2018-03-26 DIAGNOSIS — J029 Acute pharyngitis, unspecified: Secondary | ICD-10-CM

## 2018-03-26 DIAGNOSIS — J218 Acute bronchiolitis due to other specified organisms: Secondary | ICD-10-CM | POA: Diagnosis not present

## 2018-03-26 LAB — POCT RAPID STREP A (OFFICE): Rapid Strep A Screen: NEGATIVE

## 2018-03-26 MED ORDER — BENZONATATE 100 MG PO CAPS: 100.0000 mg | ORAL_CAPSULE | Freq: Two times a day (BID) | ORAL | 0 refills | Status: DC | PRN

## 2018-03-26 MED ORDER — GUAIFENESIN-CODEINE 100-10 MG/5ML PO SOLN: 5.0000 mL | Freq: Four times a day (QID) | ORAL | 0 refills | Status: DC | PRN

## 2018-03-26 NOTE — Patient Instructions (Addendum)
Please follow up if symptoms do not improve or as needed.   You may use Delsym cough syrup or Mucinex DM to help with congestion and coughing.   Acute Bronchitis, Adult  Acute bronchitis is sudden (acute) swelling of the air tubes (bronchi) in the lungs. Acute bronchitis causes these tubes to fill with mucus, which can make it hard to breathe. It can also cause coughing or wheezing. In adults, acute bronchitis usually goes away within 2 weeks. A cough caused by bronchitis may last up to 3 weeks. Smoking, allergies, and asthma can make the condition worse. Repeated episodes of bronchitis may cause further lung problems, such as chronic obstructive pulmonary disease (COPD). What are the causes? This condition can be caused by germs and by substances that irritate the lungs, including:  Cold and flu viruses. This condition is most often caused by the same virus that causes a cold.  Bacteria.  Exposure to tobacco smoke, dust, fumes, and air pollution. What increases the risk? This condition is more likely to develop in people who:  Have close contact with someone with acute bronchitis.  Are exposed to lung irritants, such as tobacco smoke, dust, fumes, and vapors.  Have a weak immune system.  Have a respiratory condition such as asthma. What are the signs or symptoms? Symptoms of this condition include:  A cough.  Coughing up clear, yellow, or green mucus.  Wheezing.  Chest congestion.  Shortness of breath.  A fever.  Body aches.  Chills.  A sore throat. How is this diagnosed? This condition is usually diagnosed with a physical exam. During the exam, your health care provider may order tests, such as chest X-rays, to rule out other conditions. He or she may also:  Test a sample of your mucus for bacterial infection.  Check the level of oxygen in your blood. This is done to check for pneumonia.  Do a chest X-ray or lung function testing to rule out pneumonia and other  conditions.  Perform blood tests. Your health care provider will also ask about your symptoms and medical history. How is this treated? Most cases of acute bronchitis clear up over time without treatment. Your health care provider may recommend:  Drinking more fluids. Drinking more makes your mucus thinner, which may make it easier to breathe.  Taking a medicine for a fever or cough.  Taking an antibiotic medicine.  Using an inhaler to help improve shortness of breath and to control a cough.  Using a cool mist vaporizer or humidifier to make it easier to breathe. Follow these instructions at home: Medicines  Take over-the-counter and prescription medicines only as told by your health care provider.  If you were prescribed an antibiotic, take it as told by your health care provider. Do not stop taking the antibiotic even if you start to feel better. General instructions   Get plenty of rest.  Drink enough fluids to keep your urine pale yellow.  Avoid smoking and secondhand smoke. Exposure to cigarette smoke or irritating chemicals will make bronchitis worse. If you smoke and you need help quitting, ask your health care provider. Quitting smoking will help your lungs heal faster.  Use an inhaler, cool mist vaporizer, or humidifier as told by your health care provider.  Keep all follow-up visits as told by your health care provider. This is important. How is this prevented? To lower your risk of getting this condition again:  Wash your hands often with soap and water. If soap and  water are not available, use hand sanitizer.  Avoid contact with people who have cold symptoms.  Try not to touch your hands to your mouth, nose, or eyes.  Make sure to get the flu shot every year. Contact a health care provider if:  Your symptoms do not improve in 2 weeks of treatment. Get help right away if:  You cough up blood.  You have chest pain.  You have severe shortness of  breath.  You become dehydrated.  You faint or keep feeling like you are going to faint.  You keep vomiting.  You have a severe headache.  Your fever or chills gets worse. This information is not intended to replace advice given to you by your health care provider. Make sure you discuss any questions you have with your health care provider. Document Released: 04/05/2004 Document Revised: 10/10/2016 Document Reviewed: 08/17/2015 Elsevier Interactive Patient Education  2019 [employer].

## 2018-03-26 NOTE — Progress Notes (Signed)
Subjective  CC:  Chief Complaint  Patient presents with  . Sinusitis    Symptoms started Saturday. She states that in the morning she is having neck stiffness. She has tried Robtussin, Mucinex, and Tylenol    HPI: SUBJECTIVE:  Laurie Mullins is a 33 y.o. female who complains of congestion, nasal blockage, post nasal drip, cough described as barky and harsh and denies sinus pain, high fevers, SOB, chest pain or significant GI symptoms. Also with ST and occipital headache. Works in the hospital.  Symptoms have been present for 4-5 days. She denies a history of anorexia, dizziness, vomiting and wheezing. She denies a history of asthma or COPD. Patient does not smoke cigarettes.  Assessment  1. Sore throat   2. Acute viral bronchiolitis      Plan  Discussion:  Supportive care.  Education regarding differences between viral and bacterial infections and treatment options are discussed.  Supportive care measures are recommended.  We discussed the use of mucolytic's, decongestants, antihistamines and antitussives as needed.  Tylenol or Advil are recommended if needed.  Follow up: Return if symptoms worsen or fail to improve.   Orders Placed This Encounter  Procedures  . POCT rapid strep A   Meds ordered this encounter  Medications  . benzonatate (TESSALON) 100 MG capsule    Sig: Take 1 capsule (100 mg total) by mouth 2 (two) times daily as needed for cough.    Dispense:  20 capsule    Refill:  0  . guaiFENesin-codeine 100-10 MG/5ML syrup    Sig: Take 5 mLs by mouth every 6 (six) hours as needed for cough.    Dispense:  120 mL    Refill:  0      I reviewed the patients updated PMH, FH, and SocHx.  Social History: Patient  reports that she has never smoked. She has never used smokeless tobacco. She reports that she does not drink alcohol or use drugs.  Patient Active Problem List   Diagnosis Date Noted  . Cystic acne 11/29/2017    Review of Systems: Cardiovascular:  negative for chest pain Respiratory: negative for SOB or hemoptysis Gastrointestinal: negative for abdominal pain Genitourinary: negative for dysuria or gross hematuria Current Meds  Medication Sig  . CLARAVIS 30 MG capsule Take 30 mg by mouth 2 (two) times daily.  Marland Kitchen levonorgestrel (MIRENA, 52 MG,) 20 MCG/24HR IUD Mirena 20 mcg/24 hours (5 yrs) 52 mg intrauterine device  Take 1 device by intrauterine route.    Objective  Vitals: BP 116/70   Pulse 90   Temp 98.1 F (36.7 C) (Oral)   Resp 16   Ht 5' 8.5" (1.74 m)   Wt 206 lb 12.8 oz (93.8 kg)   LMP 03/07/2018   SpO2 98%   BMI 30.99 kg/m  General: no acute distress  Psych:  Alert and oriented, normal mood and affect HEENT:  Normocephalic, atraumatic, supple neck, moist mucous membranes, erythematous pharynx without exudate, mild lymphadenopathy, supple neck Cardiovascular:  RRR without murmur. no edema Respiratory:  Good breath sounds bilaterally, CTAB with normal respiratory effort with occasional rhonchi Skin:  Warm, no rashes Neurologic:   Mental status is normal. normal gait  Office Visit on 03/26/2018  Component Date Value Ref Range Status  . Rapid Strep A Screen 03/26/2018 Negative  Negative Final    Commons side effects, risks, benefits, and alternatives for medications and treatment plan prescribed today were discussed, and the patient expressed understanding of the given instructions. Patient is  instructed to call or message via MyChart if he/she has any questions or concerns regarding our treatment plan. No barriers to understanding were identified. We discussed Red Flag symptoms and signs in detail. Patient expressed understanding regarding what to do in case of urgent or emergency type symptoms.  Medication list was reconciled, printed and provided to the patient in AVS. Patient instructions and summary information was reviewed with the patient as documented in the AVS. This note was prepared with assistance of Dragon  voice recognition software. Occasional wrong-word or sound-a-like substitutions may have occurred due to the inherent limitations of voice recognition software

## 2018-04-17 ENCOUNTER — Other Ambulatory Visit: Payer: Self-pay

## 2018-04-17 DIAGNOSIS — D235 Other benign neoplasm of skin of trunk: Secondary | ICD-10-CM | POA: Diagnosis not present

## 2018-04-17 DIAGNOSIS — K130 Diseases of lips: Secondary | ICD-10-CM | POA: Diagnosis not present

## 2018-04-17 DIAGNOSIS — L709 Acne, unspecified: Secondary | ICD-10-CM | POA: Diagnosis not present

## 2018-04-17 DIAGNOSIS — Z5181 Encounter for therapeutic drug level monitoring: Secondary | ICD-10-CM | POA: Diagnosis not present

## 2018-04-18 MED FILL — MYORISAN 30 MG CAPSULE: 30 | 30 days supply | Qty: 60 | Fill #0

## 2018-04-19 ENCOUNTER — Telehealth: Payer: 59 | Admitting: Nurse Practitioner

## 2018-04-19 DIAGNOSIS — J00 Acute nasopharyngitis [common cold]: Secondary | ICD-10-CM

## 2018-04-19 MED ORDER — FLUTICASONE PROPIONATE 50 MCG/ACT NA SUSP: 2.0000 | Freq: Every day | NASAL | 6 refills | Status: DC

## 2018-04-19 NOTE — Progress Notes (Signed)

## 2018-05-19 DIAGNOSIS — Z5181 Encounter for therapeutic drug level monitoring: Secondary | ICD-10-CM | POA: Diagnosis not present

## 2018-05-19 DIAGNOSIS — L709 Acne, unspecified: Secondary | ICD-10-CM | POA: Diagnosis not present

## 2018-05-19 DIAGNOSIS — K130 Diseases of lips: Secondary | ICD-10-CM | POA: Diagnosis not present

## 2018-05-21 MED FILL — MYORISAN 30 MG CAPSULE: 30 | 30 days supply | Qty: 60 | Fill #0

## 2018-07-24 DIAGNOSIS — L709 Acne, unspecified: Secondary | ICD-10-CM | POA: Diagnosis not present

## 2018-08-25 DIAGNOSIS — K130 Diseases of lips: Secondary | ICD-10-CM | POA: Diagnosis not present

## 2018-08-25 DIAGNOSIS — L700 Acne vulgaris: Secondary | ICD-10-CM | POA: Diagnosis not present

## 2018-08-25 DIAGNOSIS — L709 Acne, unspecified: Secondary | ICD-10-CM | POA: Diagnosis not present

## 2018-08-25 DIAGNOSIS — Z5181 Encounter for therapeutic drug level monitoring: Secondary | ICD-10-CM | POA: Diagnosis not present

## 2018-08-26 MED FILL — CLARAVIS 40 MG CAPSULE: 40 | 30 days supply | Qty: 30 | Fill #0

## 2018-09-25 DIAGNOSIS — K130 Diseases of lips: Secondary | ICD-10-CM | POA: Diagnosis not present

## 2018-09-25 DIAGNOSIS — Z5181 Encounter for therapeutic drug level monitoring: Secondary | ICD-10-CM | POA: Diagnosis not present

## 2018-09-25 DIAGNOSIS — L709 Acne, unspecified: Secondary | ICD-10-CM | POA: Diagnosis not present

## 2018-09-26 MED FILL — CLARAVIS 40 MG CAPSULE: 40 | 30 days supply | Qty: 30 | Fill #0

## 2018-11-03 ENCOUNTER — Other Ambulatory Visit: Payer: Self-pay

## 2018-11-03 DIAGNOSIS — D2362 Other benign neoplasm of skin of left upper limb, including shoulder: Secondary | ICD-10-CM | POA: Diagnosis not present

## 2018-11-03 DIAGNOSIS — Z5181 Encounter for therapeutic drug level monitoring: Secondary | ICD-10-CM | POA: Diagnosis not present

## 2018-11-03 DIAGNOSIS — L700 Acne vulgaris: Secondary | ICD-10-CM | POA: Diagnosis not present

## 2018-11-03 DIAGNOSIS — D2371 Other benign neoplasm of skin of right lower limb, including hip: Secondary | ICD-10-CM | POA: Diagnosis not present

## 2018-11-03 DIAGNOSIS — D235 Other benign neoplasm of skin of trunk: Secondary | ICD-10-CM | POA: Diagnosis not present

## 2018-11-03 DIAGNOSIS — L709 Acne, unspecified: Secondary | ICD-10-CM | POA: Diagnosis not present

## 2018-11-04 MED FILL — CLARAVIS 40 MG CAPSULE: 40 | 30 days supply | Qty: 30 | Fill #0

## 2018-12-04 DIAGNOSIS — L700 Acne vulgaris: Secondary | ICD-10-CM | POA: Diagnosis not present

## 2018-12-04 DIAGNOSIS — K130 Diseases of lips: Secondary | ICD-10-CM | POA: Diagnosis not present

## 2018-12-04 DIAGNOSIS — L709 Acne, unspecified: Secondary | ICD-10-CM | POA: Diagnosis not present

## 2018-12-04 DIAGNOSIS — Z5181 Encounter for therapeutic drug level monitoring: Secondary | ICD-10-CM | POA: Diagnosis not present

## 2018-12-05 MED FILL — CLARAVIS 40 MG CAPSULE: 40 | 30 days supply | Qty: 30 | Fill #0

## 2019-01-12 DIAGNOSIS — K130 Diseases of lips: Secondary | ICD-10-CM | POA: Diagnosis not present

## 2019-01-12 DIAGNOSIS — L709 Acne, unspecified: Secondary | ICD-10-CM | POA: Diagnosis not present

## 2019-01-12 DIAGNOSIS — Z5181 Encounter for therapeutic drug level monitoring: Secondary | ICD-10-CM | POA: Diagnosis not present

## 2019-01-13 MED FILL — CLARAVIS 40 MG CAPSULE: 40 | 30 days supply | Qty: 30 | Fill #0

## 2019-02-12 DIAGNOSIS — Z5181 Encounter for therapeutic drug level monitoring: Secondary | ICD-10-CM | POA: Diagnosis not present

## 2019-02-12 DIAGNOSIS — K130 Diseases of lips: Secondary | ICD-10-CM | POA: Diagnosis not present

## 2019-02-12 DIAGNOSIS — L709 Acne, unspecified: Secondary | ICD-10-CM | POA: Diagnosis not present

## 2019-02-12 DIAGNOSIS — L810 Postinflammatory hyperpigmentation: Secondary | ICD-10-CM | POA: Diagnosis not present

## 2019-02-13 MED FILL — CLARAVIS 40 MG CAPSULE: 40 | 30 days supply | Qty: 30 | Fill #0

## 2019-03-16 DIAGNOSIS — Z5181 Encounter for therapeutic drug level monitoring: Secondary | ICD-10-CM | POA: Diagnosis not present

## 2019-03-16 DIAGNOSIS — L709 Acne, unspecified: Secondary | ICD-10-CM | POA: Diagnosis not present

## 2019-03-16 DIAGNOSIS — L700 Acne vulgaris: Secondary | ICD-10-CM | POA: Diagnosis not present

## 2019-05-11 DIAGNOSIS — D239 Other benign neoplasm of skin, unspecified: Secondary | ICD-10-CM | POA: Diagnosis not present

## 2019-05-11 DIAGNOSIS — C4359 Malignant melanoma of other part of trunk: Secondary | ICD-10-CM | POA: Diagnosis not present

## 2019-05-12 ENCOUNTER — Other Ambulatory Visit: Payer: Self-pay

## 2019-05-12 DIAGNOSIS — D0359 Melanoma in situ of other part of trunk: Secondary | ICD-10-CM | POA: Diagnosis not present

## 2019-05-12 DIAGNOSIS — D039 Melanoma in situ, unspecified: Secondary | ICD-10-CM

## 2019-05-12 DIAGNOSIS — D225 Melanocytic nevi of trunk: Secondary | ICD-10-CM | POA: Diagnosis not present

## 2019-05-12 HISTORY — DX: Melanoma in situ, unspecified: D03.9

## 2019-05-26 ENCOUNTER — Telehealth: Payer: Self-pay | Admitting: Physician Assistant

## 2019-05-26 NOTE — Telephone Encounter (Signed)
Spoke to pt about recent pathology from her back with diagnosis of possible MIS recurrence. We feel that this is not a recurrence but is a separate lesion that happened to be "caught" in the suture line. It is minimally dysplastic and after revie with ST he feels like it should be excised with limited margins. Pt is comfortable with this and she was put on the phone with Laurie Mullins and was scheduled on 06/11/19 @ 3:30 with ST.

## 2019-06-11 ENCOUNTER — Ambulatory Visit (INDEPENDENT_AMBULATORY_CARE_PROVIDER_SITE_OTHER): Payer: 59 | Admitting: Dermatology

## 2019-06-11 ENCOUNTER — Other Ambulatory Visit: Payer: Self-pay

## 2019-06-11 ENCOUNTER — Encounter: Payer: Self-pay | Admitting: Dermatology

## 2019-06-11 DIAGNOSIS — D0359 Melanoma in situ of other part of trunk: Secondary | ICD-10-CM | POA: Diagnosis not present

## 2019-06-11 DIAGNOSIS — C439 Malignant melanoma of skin, unspecified: Secondary | ICD-10-CM

## 2019-06-11 DIAGNOSIS — L988 Other specified disorders of the skin and subcutaneous tissue: Secondary | ICD-10-CM | POA: Diagnosis not present

## 2019-06-11 NOTE — Patient Instructions (Signed)

## 2019-06-23 ENCOUNTER — Other Ambulatory Visit: Payer: Self-pay

## 2019-06-23 ENCOUNTER — Ambulatory Visit (INDEPENDENT_AMBULATORY_CARE_PROVIDER_SITE_OTHER): Payer: 59

## 2019-06-23 DIAGNOSIS — Z4802 Encounter for removal of sutures: Secondary | ICD-10-CM

## 2019-06-23 DIAGNOSIS — C439 Malignant melanoma of skin, unspecified: Secondary | ICD-10-CM

## 2019-06-23 NOTE — Progress Notes (Signed)
Patient here for suture removal and path results given to patient.  No signs or symptoms of infection.  She will schedule 32minute with ST for W/S.

## 2019-06-24 ENCOUNTER — Ambulatory Visit: Payer: 59

## 2019-07-02 DIAGNOSIS — Z01419 Encounter for gynecological examination (general) (routine) without abnormal findings: Secondary | ICD-10-CM | POA: Diagnosis not present

## 2019-07-09 ENCOUNTER — Ambulatory Visit (INDEPENDENT_AMBULATORY_CARE_PROVIDER_SITE_OTHER): Payer: 59 | Admitting: Dermatology

## 2019-07-09 ENCOUNTER — Other Ambulatory Visit: Payer: Self-pay

## 2019-07-09 ENCOUNTER — Encounter: Payer: Self-pay | Admitting: Dermatology

## 2019-07-09 DIAGNOSIS — D225 Melanocytic nevi of trunk: Secondary | ICD-10-CM | POA: Diagnosis not present

## 2019-07-09 DIAGNOSIS — D229 Melanocytic nevi, unspecified: Secondary | ICD-10-CM

## 2019-07-09 NOTE — Patient Instructions (Signed)

## 2019-07-13 NOTE — Progress Notes (Addendum)
   Follow-Up Visit   Subjective  Laurie Mullins is a 34 y.o. female who presents for the following: Procedure (Here for WS on right chest.) Dysplastic mole Location: Right chest  Quality:  Associated Signs/Symptoms: Modifying Factors: History of melanoma Severity:  Timing: Context: For wider shave excision  The following portions of the chart were reviewed this encounter and updated as appropriate:     Objective  Well appearing patient in no apparent distress; mood and affect are within normal limits.  Focused skin examination: chest   Assessment & Plan  Atypical mole Right Chest  Wider, deeper shave 1.78mm margin. 1.76mm margin  Epidermal / dermal shaving - Right Chest  Lesion length (cm):  1.4 Lesion width (cm):  1.4 Margin per side (cm):  0 Total excision diameter (cm):  1.4 Informed consent: discussed and consent obtained   Timeout: patient name, date of birth, surgical site, and procedure verified   Procedure prep:  Patient was prepped and draped in usual sterile fashion Prep type:  Isopropyl alcohol Anesthesia: the lesion was anesthetized in a standard fashion   Anesthetic:  1% lidocaine w/ epinephrine 1-100,000 local infiltration Instrument used: flexible razor blade   Hemostasis achieved with: pressure, aluminum chloride and electrodesiccation   Outcome: patient tolerated procedure well   Post-procedure details: sterile dressing applied and wound care instructions given   Dressing type: bandage and petrolatum    Specimen 1 - Surgical pathology Differential Diagnosis: R/O atypica Check Margins: No [id number] WS

## 2019-07-27 NOTE — Progress Notes (Addendum)
   Follow-Up Visit   Subjective  Laurie Mullins is a 34 y.o. female who presents for the following: Procedure (Patient here today for MIS ).   The following portions of the chart were reviewed this encounter and updated as appropriate:     Objective  Well appearing patient in no apparent distress; mood and affect are within normal limits.  A focused examination was performed including back. Relevant physical exam findings are noted in the Assessment and Plan.   Assessment & Plan  Melanoma in situ of trunk (HCC) Left Side Back, Inferior edge of MIS scar  Skin excision  Lesion length (cm):  0.3 Lesion width (cm):  0.3 Margin per side (cm):  0.7 Total excision diameter (cm):  1.7 Informed consent: discussed and consent obtained   Timeout: patient name, date of birth, surgical site, and procedure verified   Procedure prep:  Patient was prepped and draped in usual sterile fashion Anesthesia: the lesion was anesthetized in a standard fashion   Anesthetic:  1% lidocaine w/ epinephrine 1-100,000 local infiltration Instrument used: #15 blade   Hemostasis achieved with: suture   Outcome: patient tolerated procedure well with no complications   Additional details:  Old photos reviewed and this lesion distinct from original melanoma excised, NOT recurrent. Total length of excision 4.5cm.  Skin repair Complexity:  Intermediate Final length (cm):  4.5 Informed consent: discussed and consent obtained   Reason for type of repair: reduce tension to allow closure, reduce the risk of dehiscence, infection, and necrosis, reduce subcutaneous dead space and avoid a hematoma, allow closure of the large defect, preserve normal anatomy, preserve normal anatomical and functional relationships and enhance both functionality and cosmetic results   Undermining: edges undermined   Subcutaneous layers (deep stitches):  Suture size:  3-0 Suture type: Vicryl (polyglactin 910)   Subcutaneous suture  technique: inverted dermal. Fine/surface layer approximation (top stitches):  Suture size:  3-0 Suture type comment:  Ethilon Stitches: simple interrupted   Suture removal (days):  14 Hemostasis achieved with: suture and pressure Hemostasis achieved with comment:  Electrocautery Outcome: patient tolerated procedure well with no complications   Post-procedure details: sterile dressing applied and wound care instructions given   Dressing type: bandage and pressure dressing (mupirocin)    Specimen 1 - Surgical pathology Differential Diagnosis: R/O MIS Check Margins: No Prev.Bx [id number] Inferior Lateral Margin Stained Excision

## 2019-11-12 DIAGNOSIS — Z23 Encounter for immunization: Secondary | ICD-10-CM | POA: Diagnosis not present

## 2020-01-05 ENCOUNTER — Ambulatory Visit: Payer: 59 | Admitting: Dermatology

## 2020-01-05 ENCOUNTER — Other Ambulatory Visit: Payer: Self-pay

## 2020-01-05 ENCOUNTER — Encounter: Payer: Self-pay | Admitting: Dermatology

## 2020-01-05 DIAGNOSIS — Z1283 Encounter for screening for malignant neoplasm of skin: Secondary | ICD-10-CM

## 2020-01-05 DIAGNOSIS — Z86018 Personal history of other benign neoplasm: Secondary | ICD-10-CM

## 2020-01-05 DIAGNOSIS — Z86006 Personal history of melanoma in-situ: Secondary | ICD-10-CM

## 2020-01-07 ENCOUNTER — Encounter: Payer: Self-pay | Admitting: Dermatology

## 2020-01-07 NOTE — Progress Notes (Signed)
   Follow-Up Visit   Subjective  Laurie Mullins is a 34 y.o. female who presents for the following: Follow-up (Patient here today for 6 month skin check. Per patient no new concerns).  Melanoma follow-up Location:  Duration:  Quality:  Associated Signs/Symptoms: Modifying Factors:  Severity:  Timing: Context:   Objective  Well appearing patient in no apparent distress; mood and affect are within normal limits.  A full examination was performed including scalp, head, eyes, ears, nose, lips, neck, chest, axillae, abdomen, back, buttocks, bilateral upper extremities, bilateral lower extremities, hands, feet, fingers, toes, fingernails, and toenails. All findings within normal limits unless otherwise noted below.   Assessment & Plan    History of melanoma in situ Left Flank  Self examined twice annually, dermatologist exam yearly  History of atypical skin mole (9) Right Hip (side) - Posterior; Left Forearm - Anterior; Right Thigh - Posterior; Right Lower Leg - Posterior; Right Breast; Left Inframammary Fold; Left Upper Back; Right Upper Back; Mid Back  Annual skin examination.  Skin exam for malignant neoplasm Neck - Anterior  Annual skin examination  Skin cancer screening performed today.    I, Lavonna Monarch, MD, have reviewed all documentation for this visit.  The documentation on 02/04/20 for the exam, diagnosis, procedures, and orders are all accurate and complete.

## 2020-04-17 ENCOUNTER — Telehealth: Payer: 59 | Admitting: Family

## 2020-04-17 DIAGNOSIS — U071 COVID-19: Secondary | ICD-10-CM

## 2020-04-17 MED ORDER — PREDNISONE 10 MG (21) PO TBPK: ORAL_TABLET | ORAL | 0 refills | Status: DC

## 2020-04-17 MED ORDER — NAPROXEN 500 MG PO TABS: 500.0000 mg | ORAL_TABLET | Freq: Two times a day (BID) | ORAL | 0 refills | Status: DC

## 2020-04-17 MED ORDER — FLUTICASONE PROPIONATE 50 MCG/ACT NA SUSP
2.0000 | Freq: Every day | NASAL | 6 refills | Status: DC
  Filled 2020-06-13: qty 16, 30d supply, fill #0

## 2020-04-17 NOTE — Progress Notes (Signed)
E-Visit for Corona Virus Screening  We are sorry you are not feeling well. We are here to help!  You have tested positive for COVID-19, meaning that you were infected with the novel coronavirus and could give the virus to others.  It is vitally important that you stay home so you do not spread it to others.      Please continue isolation at home, for at least 10 days since the start of your symptoms and until you have had 24 hours with no fever (without taking a fever reducer) and with improving of symptoms.  If you have no symptoms but tested positive (or all symptoms resolve after 5 days and you have no fever) you can leave your house but continue to wear a mask around others for an additional 5 days. If you have a fever,continue to stay home until you have had 24 hours of no fever. Most cases improve 5-10 days from onset but we have seen a small number of patients who have gotten worse after the 10 days.  Please be sure to watch for worsening symptoms and remain taking the proper precautions.   Go to the nearest hospital ED for assessment if fever/cough/breathlessness are severe or illness seems like a threat to life.    The following symptoms may appear 2-14 days after exposure: . Fever . Cough . Shortness of breath or difficulty breathing . Chills . Repeated shaking with chills . Muscle pain . Headache . Sore throat . New loss of taste or smell . Fatigue . Congestion or runny nose . Nausea or vomiting . Diarrhea  You have been enrolled in [facility] for COVID-19. Daily you will receive a questionnaire within the [facility] website. Our COVID-19 response team will be monitoring your responses daily.  You can use medication such as A prescription anti-inflammatory called Naprosyn 500 mg. Take twice daily as needed for fever or body aches for 2 weeks and A prescription for Fluticasone nasal spray 2 sprays in each nostril one time per day and I have sent in a prednisone dose  pack. This should help with your inflammation.     You may also take acetaminophen (Tylenol) as needed for fever.  HOME CARE: . Only take medications as instructed by your medical team. . Drink plenty of fluids and get plenty of rest. . A steam or ultrasonic humidifier can help if you have congestion.   GET HELP RIGHT AWAY IF YOU HAVE EMERGENCY WARNING SIGNS.  Call 911 or proceed to your closest emergency facility if: . You develop worsening high fever. . Trouble breathing . Bluish lips or face . Persistent pain or pressure in the chest . New confusion . Inability to wake or stay awake . You cough up blood. . Your symptoms become more severe . Inability to hold down food or fluids  This list is not all possible symptoms. Contact your medical provider for any symptoms that are severe or concerning to you.    Your e-visit answers were reviewed by a board certified advanced clinical practitioner to complete your personal care plan.  Depending on the condition, your plan could have included both over the counter or prescription medications.  If there is a problem please reply once you have received a response from your provider.  Your safety is important to [place].  If you have drug allergies check your prescription carefully.    You can use MyChart to ask questions about today's visit, request a non-urgent call back,  or ask for a work or school excuse for 24 hours related to this e-Visit. If it has been greater than 24 hours you will need to follow up with your provider, or enter a new e-Visit to address those concerns. You will get an e-mail in the next two days asking about your experience.  I hope that your e-visit has been valuable and will speed your recovery. Thank you for using e-visits.   Approximately 5 minutes was spent documenting and reviewing patient's chart.

## 2020-06-13 ENCOUNTER — Other Ambulatory Visit (HOSPITAL_COMMUNITY): Payer: Self-pay

## 2020-06-23 ENCOUNTER — Other Ambulatory Visit (HOSPITAL_COMMUNITY): Payer: Self-pay

## 2020-09-21 ENCOUNTER — Encounter (HOSPITAL_COMMUNITY): Payer: Self-pay

## 2020-09-21 ENCOUNTER — Emergency Department (HOSPITAL_COMMUNITY): Payer: 59

## 2020-09-21 ENCOUNTER — Observation Stay (HOSPITAL_COMMUNITY)
Admission: EM | Admit: 2020-09-21 | Discharge: 2020-09-22 | Disposition: A | Discharge status: Home / Self Care | Payer: 59 | Attending: Internal Medicine | Admitting: Internal Medicine

## 2020-09-21 ENCOUNTER — Other Ambulatory Visit: Payer: Self-pay

## 2020-09-21 DIAGNOSIS — Z79899 Other long term (current) drug therapy: Secondary | ICD-10-CM | POA: Diagnosis not present

## 2020-09-21 DIAGNOSIS — R102 Pelvic and perineal pain: Secondary | ICD-10-CM | POA: Diagnosis not present

## 2020-09-21 DIAGNOSIS — Z85828 Personal history of other malignant neoplasm of skin: Secondary | ICD-10-CM | POA: Insufficient documentation

## 2020-09-21 DIAGNOSIS — I7774 Dissection of vertebral artery: Secondary | ICD-10-CM | POA: Diagnosis not present

## 2020-09-21 DIAGNOSIS — R519 Headache, unspecified: Secondary | ICD-10-CM | POA: Diagnosis present

## 2020-09-21 DIAGNOSIS — M542 Cervicalgia: Secondary | ICD-10-CM | POA: Diagnosis not present

## 2020-09-21 DIAGNOSIS — Z8616 Personal history of COVID-19: Secondary | ICD-10-CM | POA: Diagnosis not present

## 2020-09-21 DIAGNOSIS — I6503 Occlusion and stenosis of bilateral vertebral arteries: Secondary | ICD-10-CM | POA: Diagnosis not present

## 2020-09-21 DIAGNOSIS — I639 Cerebral infarction, unspecified: Secondary | ICD-10-CM | POA: Diagnosis present

## 2020-09-21 DIAGNOSIS — U071 COVID-19: Secondary | ICD-10-CM

## 2020-09-21 DIAGNOSIS — H538 Other visual disturbances: Secondary | ICD-10-CM | POA: Diagnosis not present

## 2020-09-21 DIAGNOSIS — Z20822 Contact with and (suspected) exposure to covid-19: Secondary | ICD-10-CM | POA: Diagnosis not present

## 2020-09-21 LAB — CBC WITH DIFFERENTIAL/PLATELET
Abs Immature Granulocytes: 0.03 10*3/uL (ref 0.00–0.07)
Basophils Absolute: 0 10*3/uL (ref 0.0–0.1)
Basophils Relative: 0 %
Eosinophils Absolute: 0.1 10*3/uL (ref 0.0–0.5)
Eosinophils Relative: 1 %
HCT: 42.6 % (ref 36.0–46.0)
Hemoglobin: 14 g/dL (ref 12.0–15.0)
Immature Granulocytes: 0 %
Lymphocytes Relative: 35 %
Lymphs Abs: 2.8 10*3/uL (ref 0.7–4.0)
MCH: 29.1 pg (ref 26.0–34.0)
MCHC: 32.9 g/dL (ref 30.0–36.0)
MCV: 88.6 fL (ref 80.0–100.0)
Monocytes Absolute: 0.5 10*3/uL (ref 0.1–1.0)
Monocytes Relative: 6 %
Neutro Abs: 4.6 10*3/uL (ref 1.7–7.7)
Neutrophils Relative %: 58 %
Platelets: 184 10*3/uL (ref 150–400)
RBC: 4.81 MIL/uL (ref 3.87–5.11)
RDW: 12.2 % (ref 11.5–15.5)
WBC: 8.1 10*3/uL (ref 4.0–10.5)
nRBC: 0 % (ref 0.0–0.2)

## 2020-09-21 LAB — I-STAT BETA HCG BLOOD, ED (MC, WL, AP ONLY): I-stat hCG, quantitative: <5 m[IU]/mL (ref <5)

## 2020-09-21 LAB — BASIC METABOLIC PANEL
Anion gap: 6 (ref 5–15)
BUN: 14 mg/dL (ref 6–20)
CO2: 26 mmol/L (ref 22–32)
Calcium: 8.9 mg/dL (ref 8.9–10.3)
Chloride: 106 mmol/L (ref 98–111)
Creatinine, Ser: 0.71 mg/dL (ref 0.44–1.00)
GFR, Estimated: >60 mL/min (ref >60)
Glucose, Bld: 96 mg/dL (ref 70–99)
Potassium: 3.9 mmol/L (ref 3.5–5.1)
Sodium: 138 mmol/L (ref 135–145)

## 2020-09-21 MED ORDER — KETOROLAC TROMETHAMINE 15 MG/ML IJ SOLN
15.0000 mg | Freq: Once | INTRAMUSCULAR | Status: AC
  Administered 2020-09-21: 15 mg via INTRAVENOUS
  Filled 2020-09-21: qty 1

## 2020-09-21 MED ORDER — DIPHENHYDRAMINE HCL 50 MG/ML IJ SOLN
25.0000 mg | Freq: Once | INTRAMUSCULAR | Status: AC
  Administered 2020-09-21: 25 mg via INTRAVENOUS
  Filled 2020-09-21: qty 1

## 2020-09-21 MED ORDER — SODIUM CHLORIDE (PF) 0.9 % IJ SOLN
INTRAMUSCULAR | Status: AC
  Filled 2020-09-21: qty 50

## 2020-09-21 MED ORDER — IOHEXOL 350 MG/ML SOLN
80.0000 mL | Freq: Once | INTRAVENOUS | Status: AC | PRN
  Administered 2020-09-21: 80 mL via INTRAVENOUS

## 2020-09-21 MED ORDER — METOCLOPRAMIDE HCL 5 MG/ML IJ SOLN
10.0000 mg | Freq: Once | INTRAMUSCULAR | Status: AC
  Administered 2020-09-21: 10 mg via INTRAVENOUS
  Filled 2020-09-21: qty 2

## 2020-09-21 NOTE — Discharge Instructions (Addendum)
A referral was placed for neurologist to help arrange for follow-up appointment.  They should contact you within 2 business days.  If you do not hear from them at this time, call the number above for [facility] neurology.  It is important that you follow-up with a neurologist for your condition.  Please avoid anything that may cause trauma, whiplash, or manipulation in your neck.  This includes high impact sports, massage, or chiropractor manipulation.  You can very gently rub over-the-counter muscle rubs onto your neck, which may help with the pain.

## 2020-09-21 NOTE — ED Provider Notes (Signed)
  Provider Note MRN:  [id number]  Arrival date & time: 09/21/20    ED Course and Medical Decision Making  Assumed care from Dr. Langston Masker at shift change.  Vertebral artery dissection awaiting teleneuro recs.  Potentially will be discharged on asa and plavix.  Patient evaluated by telemetry neurologist, recommending admission, MRI, dual antiplatelet therapy.  Will admit to hospitalist.  Procedures  Final Clinical Impressions(s) / ED Diagnoses     ICD-10-CM   1. Vertebral artery dissection ([place])  I77.74       ED Discharge Orders          Ordered    Ambulatory referral to Neurology       Comments: An appointment is requested in approximately: 1 week Acute vertebral artery dissection   09/21/20 2331             Barth Kirks. Sedonia Small, [place] [email or web address]    Maudie Flakes, MD 09/22/20 0201

## 2020-09-21 NOTE — ED Triage Notes (Signed)
Pt c/o blurred vision, starting at [facility] today. Pt stated she has been having neck pain and headaches starting Friday 7/8, worsening over the weekend. Pt states her vision is currently blurry.

## 2020-09-21 NOTE — ED Provider Notes (Signed)
[facility] DEPT Provider Note   CSN: [id number] Arrival date & time: 09/21/20  1914     History Chief Complaint  Patient presents with   Blurred Vision   Neck Pain    Laurie Mullins is a 35 y.o. female presented emerged part of neck pain and blurred vision.  The patient reports that she woke up with pain on the right side of her posterior neck 3 days ago.  She denies any preceding falls, trauma, massage, spinal manipulation.  She says the pain has been persistent throughout the weekend and evolved into a headache which involves the right side of her head and right forehead.  She has been taking naproxen with minimal relief.  She reports that today she was at work and noted that the vision on the page was "blurry, like also consider kaleidoscope".  She feels it involved both eyes, perhaps right more than the left.  She says the visual symptoms have since improved.  She continues to have a throbbing, intense headache, involving the area behind her right eye.  She also reports continued pain in her right neck.  She denies any recent fevers or chills.  She denies any new rash.  She had COVID back in February and is otherwise vaccinated.  She denies any other medical problems.  She denies any history of migraines.  HPI     Past Medical History:  Diagnosis Date   Atypical mole 01/01/2018   moderate atypia - right calf   Atypical mole 01/01/2018   moderate atypia - right buttock   Atypical mole 01/01/2018   moderate atypia - right upper back   Atypical mole 04/17/2018   mild atypia - left mid paraspinal   Atypical mole 04/17/2018   mild atypia - right mid paraspinal   Atypical mole 04/17/2018   mild atypia - right mid back   Atypical mole 04/17/2018   moderate atypia - left upper back   Atypical mole 11/03/2018   mild atypia - mid spine   Atypical mole 11/03/2018   mild atypia - right outer thigh   Atypical mole 11/03/2018   moderate atypia -  left outer forearm   Atypical mole 11/03/2018   moderate atypia - under left breast   Atypical mole 05/12/2019   moderate to severe - right chest (widershave)   Cystic acne 11/29/2017   History of chicken pox    Melanoma ([place]) 01/01/2018   melanoma in situ on left side back - excision with margins free   Melanoma in situ ([place]) 05/12/2019   residual MIS on left side back - excision done 06/11/2019    Patient Active Problem List   Diagnosis Date Noted   Cystic acne 11/29/2017    Past Surgical History:  Procedure Laterality Date   DILATION AND EVACUATION Bilateral 03/26/2013   Procedure: DILATATION AND EVACUATION;  Surgeon: Daria Pastures, MD;  Location: [facility];  Service: Gynecology;  Laterality: Bilateral;   WISDOM TOOTH EXTRACTION     WISDOM TOOTH EXTRACTION       OB History     Gravida  2   Para  2   Term  2   Preterm  0   AB  0   Living  2      SAB  0   IAB  0   Ectopic  0   Multiple  0   Live Births  2           Family History  Problem Relation Age of Onset   Hypertension Mother    Kidney Stones Mother    Diabetes Paternal Grandmother    COPD Paternal Grandmother    Heart disease Paternal Grandmother    Cancer Paternal Grandmother    Heart attack Maternal Grandmother    Hyperlipidemia Maternal Grandmother    Heart attack Maternal Grandfather    Heart disease Maternal Grandfather    Cancer Paternal Grandfather    Healthy Son    Healthy Son     Social History   Tobacco Use   Smoking status: Never   Smokeless tobacco: Never  Vaping Use   Vaping Use: Never used  Substance Use Topics   Alcohol use: No   Drug use: No    Home Medications Prior to Admission medications   Medication Sig Start Date End Date Taking? Authorizing Provider  fluticasone (FLONASE) 50 MCG/ACT nasal spray Place 2 sprays into both nostrils daily. 04/17/20   Sharion Balloon, FNP  levonorgestrel (MIRENA, 52 MG,) 20 MCG/24HR IUD Mirena 20 mcg/24 hours (6 yrs) 52  mg intrauterine device  Take 1 device by intrauterine route.    [provider]  naproxen (NAPROSYN) 500 MG tablet Take 1 tablet (500 mg total) by mouth 2 (two) times daily with a meal. 04/17/20   Sharion Balloon, FNP  PFIZER-BIONTECH COVID-19 VACC 30 MCG/0.3ML injection  11/12/19   [provider]  predniSONE (STERAPRED UNI-PAK 21 TAB) 10 MG (21) TBPK tablet Use as directed 04/17/20   Sharion Balloon, FNP    Allergies    Patient has no known allergies.  Review of Systems   Review of Systems  Constitutional:  Negative for chills and fever.  Eyes:  Positive for visual disturbance. Negative for photophobia and pain.  Respiratory:  Negative for cough and shortness of breath.   Cardiovascular:  Negative for chest pain and palpitations.  Gastrointestinal:  Positive for nausea. Negative for abdominal pain and vomiting.  Genitourinary:  Negative for dysuria and hematuria.  Musculoskeletal:  Positive for neck pain. Negative for arthralgias.  Skin:  Negative for color change and rash.  Neurological:  Positive for headaches. Negative for seizures, syncope, speech difficulty and weakness.  All other systems reviewed and are negative.  Physical Exam Updated Vital Signs BP 129/71 (BP Location: Left Arm)   Pulse 81   Temp 98.1 F (36.7 C) (Oral)   Resp 18   Ht 5' 8.5" (1.74 m)   Wt 99.8 kg   SpO2 99%   BMI 32.96 kg/m   Physical Exam Constitutional:      General: She is not in acute distress. HENT:     Head: Normocephalic and atraumatic.  Eyes:     General:        Right eye: No discharge.        Left eye: No discharge.     Extraocular Movements: Extraocular movements intact.     Conjunctiva/sclera: Conjunctivae normal.     Pupils: Pupils are equal, round, and reactive to light.     Comments: Peripheral fields intact  Neck:     Comments: Right posterior-lateral neck tenderness Cardiovascular:     Rate and Rhythm: Normal rate and regular rhythm.     Pulses: Normal  pulses.  Pulmonary:     Effort: Pulmonary effort is normal. No respiratory distress.  Abdominal:     General: There is no distension.     Tenderness: There is no abdominal tenderness.  Musculoskeletal:     Cervical back:  Neck supple.  Skin:    General: Skin is warm and dry.  Neurological:     General: No focal deficit present.     Mental Status: She is alert and oriented to person, place, and time. Mental status is at baseline.     Sensory: No sensory deficit.     Motor: No weakness.  Psychiatric:        Mood and Affect: Mood normal.        Behavior: Behavior normal.    ED Results / Procedures / Treatments   Labs (all labs ordered are listed, but only abnormal results are displayed) Labs Reviewed - No data to display  EKG None  Radiology No results found.  Procedures Procedures   Medications Ordered in ED Medications - No data to display  ED Course  I have reviewed the triage vital signs and the nursing notes.  Pertinent labs & imaging results that were available during my care of the patient were reviewed by me and considered in my medical decision making (see chart for details).  This patient complains of headache, neck pain..  This involves an extensive number of treatment options, and is a complaint that carries with it a high risk of complications and morbidity.  The differential diagnosis includes vascular injury vs aneurysm vs complex migraine vs MS vs other  I ordered, reviewed, and interpreted labs.  No life-threatening abnormalities were noted on these tests. I ordered medication for migraine/headache I ordered imaging studies which included CTA Head and Neck I independently visualized and interpreted imaging which showed left vertebral dissection  I consulted tele-neurologist and discussed lab and imaging findings   Clinical Course as of 09/22/20 0954  Wed Sep 21, 2020  2240 Patient's headache is improved substantially.  She reports no further blurred  vision.  We are awaiting the results of her CT imaging [MT]  2254 Tele Neuro consult placed [MT]  2317 Consult was placed through the teleneurology service.  We are pending evaluation.  The patient and her husband both updated regarding her diagnosis.  Her headache remains minimal and controlled. [MT]  2318 IMPRESSION: 1. Acute dissection involving the left V3/proximal V4 segment, extending from the left transverse process of C1 to just beyond the dural reflection. Associated luminal narrowing of up to approximately 50% with small amount of irregular intraluminal thrombus protruding into the vascular lumen. No visible downstream embolic occlusion. 2. Otherwise negative CTA of the head and neck. No large vessel occlusion. No other hemodynamically significant or correctable stenosis. 3. No other acute intracranial abnormality. [MT]    Clinical Course User Index [MT] Veida Spira, Carola Rhine, MD    Final Clinical Impression(s) / ED Diagnoses Vertebral artery dissection Headache   Rx / DC Orders ED Discharge Orders     None        Wyvonnia Dusky, MD 09/22/20 [phone]

## 2020-09-22 ENCOUNTER — Observation Stay (HOSPITAL_COMMUNITY): Payer: 59

## 2020-09-22 ENCOUNTER — Encounter (HOSPITAL_COMMUNITY): Payer: Self-pay | Admitting: Internal Medicine

## 2020-09-22 ENCOUNTER — Other Ambulatory Visit (HOSPITAL_COMMUNITY): Payer: Self-pay

## 2020-09-22 DIAGNOSIS — M542 Cervicalgia: Secondary | ICD-10-CM | POA: Diagnosis not present

## 2020-09-22 DIAGNOSIS — I7774 Dissection of vertebral artery: Secondary | ICD-10-CM | POA: Diagnosis not present

## 2020-09-22 DIAGNOSIS — Z8616 Personal history of COVID-19: Secondary | ICD-10-CM | POA: Diagnosis not present

## 2020-09-22 DIAGNOSIS — Z85828 Personal history of other malignant neoplasm of skin: Secondary | ICD-10-CM | POA: Diagnosis not present

## 2020-09-22 DIAGNOSIS — R29818 Other symptoms and signs involving the nervous system: Secondary | ICD-10-CM | POA: Diagnosis not present

## 2020-09-22 DIAGNOSIS — Z79899 Other long term (current) drug therapy: Secondary | ICD-10-CM | POA: Diagnosis not present

## 2020-09-22 DIAGNOSIS — Z20822 Contact with and (suspected) exposure to covid-19: Secondary | ICD-10-CM | POA: Diagnosis not present

## 2020-09-22 DIAGNOSIS — Z85820 Personal history of malignant melanoma of skin: Secondary | ICD-10-CM

## 2020-09-22 DIAGNOSIS — H538 Other visual disturbances: Secondary | ICD-10-CM | POA: Diagnosis not present

## 2020-09-22 DIAGNOSIS — I639 Cerebral infarction, unspecified: Secondary | ICD-10-CM | POA: Diagnosis present

## 2020-09-22 DIAGNOSIS — R102 Pelvic and perineal pain: Secondary | ICD-10-CM | POA: Diagnosis not present

## 2020-09-22 HISTORY — DX: Personal history of malignant melanoma of skin: Z85.820

## 2020-09-22 LAB — CBC
HCT: 40.6 % (ref 36.0–46.0)
Hemoglobin: 13.4 g/dL (ref 12.0–15.0)
MCH: 29.1 pg (ref 26.0–34.0)
MCHC: 33 g/dL (ref 30.0–36.0)
MCV: 88.3 fL (ref 80.0–100.0)
Platelets: 184 10*3/uL (ref 150–400)
RBC: 4.6 MIL/uL (ref 3.87–5.11)
RDW: 12.4 % (ref 11.5–15.5)
WBC: 8.9 10*3/uL (ref 4.0–10.5)
nRBC: 0 % (ref 0.0–0.2)

## 2020-09-22 LAB — COMPREHENSIVE METABOLIC PANEL
ALT: 16 U/L (ref 0–44)
AST: 16 U/L (ref 15–41)
Albumin: 3.4 g/dL — ABNORMAL LOW (ref 3.5–5.0)
Alkaline Phosphatase: 54 U/L (ref 38–126)
Anion gap: 3 — ABNORMAL LOW (ref 5–15)
BUN: 12 mg/dL (ref 6–20)
CO2: 25 mmol/L (ref 22–32)
Calcium: 8.8 mg/dL — ABNORMAL LOW (ref 8.9–10.3)
Chloride: 109 mmol/L (ref 98–111)
Creatinine, Ser: 0.68 mg/dL (ref 0.44–1.00)
GFR, Estimated: >60 mL/min (ref >60)
Glucose, Bld: 111 mg/dL — ABNORMAL HIGH (ref 70–99)
Potassium: 3.9 mmol/L (ref 3.5–5.1)
Sodium: 137 mmol/L (ref 135–145)
Total Bilirubin: 0.7 mg/dL (ref 0.3–1.2)
Total Protein: 6.4 g/dL — ABNORMAL LOW (ref 6.5–8.1)

## 2020-09-22 LAB — LIPID PANEL
Cholesterol: 185 mg/dL (ref 0–200)
HDL: 43 mg/dL (ref >40)
LDL Cholesterol: 128 mg/dL — ABNORMAL HIGH (ref 0–99)
Total CHOL/HDL Ratio: 4.3 RATIO
Triglycerides: 69 mg/dL (ref <150)
VLDL: 14 mg/dL (ref 0–40)

## 2020-09-22 LAB — RESP PANEL BY RT-PCR (FLU A&B, COVID) ARPGX2
Influenza A by PCR: NEGATIVE
Influenza B by PCR: NEGATIVE
SARS Coronavirus 2 by RT PCR: NEGATIVE

## 2020-09-22 LAB — HEMOGLOBIN A1C
Hgb A1c MFr Bld: 5.4 % (ref 4.8–5.6)
Mean Plasma Glucose: 108.28 mg/dL

## 2020-09-22 LAB — HIV ANTIBODY (ROUTINE TESTING W REFLEX): HIV Screen 4th Generation wRfx: NONREACTIVE

## 2020-09-22 MED ORDER — CLOPIDOGREL BISULFATE 75 MG PO TABS
75.0000 mg | ORAL_TABLET | Freq: Every day | ORAL | 0 refills | Status: DC
  Filled 2020-09-22 (×2): qty 30, 30d supply, fill #0

## 2020-09-22 MED ORDER — ONDANSETRON HCL 4 MG/2ML IJ SOLN: 4.0000 mg | Freq: Four times a day (QID) | INTRAMUSCULAR | Status: DC | PRN

## 2020-09-22 MED ORDER — ATORVASTATIN CALCIUM 40 MG PO TABS: 40.0000 mg | ORAL_TABLET | Freq: Every day | ORAL | 0 refills | Status: DC

## 2020-09-22 MED ORDER — CLOPIDOGREL BISULFATE 75 MG PO TABS: 75.0000 mg | ORAL_TABLET | Freq: Every day | ORAL | 0 refills | Status: DC

## 2020-09-22 MED ORDER — CLOPIDOGREL BISULFATE 75 MG PO TABS
75.0000 mg | ORAL_TABLET | Freq: Every day | ORAL | Status: DC
  Administered 2020-09-22: 75 mg via ORAL
  Filled 2020-09-22: qty 1

## 2020-09-22 MED ORDER — ASPIRIN EC 81 MG PO TBEC
81.0000 mg | DELAYED_RELEASE_TABLET | Freq: Every day | ORAL | Status: DC
  Administered 2020-09-22: 81 mg via ORAL
  Filled 2020-09-22 (×2): qty 1

## 2020-09-22 MED ORDER — POLYETHYLENE GLYCOL 3350 17 G PO PACK: 17.0000 g | PACK | Freq: Every day | ORAL | Status: DC | PRN

## 2020-09-22 MED ORDER — HYDROCODONE-ACETAMINOPHEN 5-325 MG PO TABS
1.0000 | ORAL_TABLET | Freq: Four times a day (QID) | ORAL | 0 refills | Status: DC | PRN
  Filled 2020-09-22: qty 14, 4d supply, fill #0

## 2020-09-22 MED ORDER — ASPIRIN 81 MG PO TBEC
81.0000 mg | DELAYED_RELEASE_TABLET | Freq: Every day | ORAL | 0 refills | Status: AC
  Filled 2020-09-22: qty 30, 30d supply, fill #0

## 2020-09-22 MED ORDER — FLUTICASONE PROPIONATE 50 MCG/ACT NA SUSP: 2.0000 | Freq: Every day | NASAL | Status: DC | PRN

## 2020-09-22 MED ORDER — ACETAMINOPHEN 325 MG PO TABS: 650.0000 mg | ORAL_TABLET | ORAL | Status: DC | PRN

## 2020-09-22 MED ORDER — HYDROCODONE-ACETAMINOPHEN 5-325 MG PO TABS
1.0000 | ORAL_TABLET | Freq: Four times a day (QID) | ORAL | Status: DC | PRN
Start: 2020-09-22 — End: 2020-09-22
  Administered 2020-09-22 (×2): 1 via ORAL
  Filled 2020-09-22 (×2): qty 1

## 2020-09-22 MED ORDER — ATORVASTATIN CALCIUM 40 MG PO TABS
40.0000 mg | ORAL_TABLET | Freq: Every day | ORAL | Status: DC
  Administered 2020-09-22: 40 mg via ORAL
  Filled 2020-09-22: qty 1

## 2020-09-22 MED ORDER — NAPROXEN 500 MG PO TABS
500.0000 mg | ORAL_TABLET | Freq: Two times a day (BID) | ORAL | Status: DC | PRN
Start: 2020-09-22 — End: 2020-09-29

## 2020-09-22 MED ORDER — CLOPIDOGREL BISULFATE 75 MG PO TABS
225.0000 mg | ORAL_TABLET | Freq: Once | ORAL | Status: AC
  Administered 2020-09-22: 225 mg via ORAL
  Filled 2020-09-22: qty 3

## 2020-09-22 MED ORDER — GADOBUTROL 1 MMOL/ML IV SOLN
10.0000 mL | Freq: Once | INTRAVENOUS | Status: AC | PRN
  Administered 2020-09-22: 10 mL via INTRAVENOUS

## 2020-09-22 MED ORDER — FLUTICASONE PROPIONATE 50 MCG/ACT NA SUSP: 2.0000 | Freq: Every day | NASAL | Status: AC | PRN

## 2020-09-22 MED ORDER — ENOXAPARIN SODIUM 40 MG/0.4ML IJ SOSY
40.0000 mg | PREFILLED_SYRINGE | INTRAMUSCULAR | Status: DC
  Administered 2020-09-22: 40 mg via SUBCUTANEOUS
  Filled 2020-09-22: qty 0.4

## 2020-09-22 MED ORDER — ATORVASTATIN CALCIUM 40 MG PO TABS
40.0000 mg | ORAL_TABLET | Freq: Every day | ORAL | 0 refills | Status: DC
  Filled 2020-09-22: qty 30, 30d supply, fill #0

## 2020-09-22 MED ORDER — ASPIRIN 81 MG PO TBEC: 81.0000 mg | DELAYED_RELEASE_TABLET | Freq: Every day | ORAL | 0 refills | Status: DC

## 2020-09-22 MED ORDER — ACETAMINOPHEN 650 MG RE SUPP: 650.0000 mg | RECTAL | Status: DC | PRN

## 2020-09-22 MED ORDER — STROKE: EARLY STAGES OF RECOVERY BOOK
Freq: Once | Status: DC
  Filled 2020-09-22: qty 1

## 2020-09-22 MED ORDER — ACETAMINOPHEN 160 MG/5ML PO SOLN: 650.0000 mg | ORAL | Status: DC | PRN

## 2020-09-22 NOTE — Consult Note (Signed)
[facility] TeleNeurology Consult Services  Stat Consult  Date of Service:   09/21/2020 22:54:27  Diagnosis:       I67.0 - Dissection of cerebral arteries, nonruptured       H53.8 - Blurred Vision  Impression: Patient history and exam and imaging studies are suggestive of a vertebral artery dissection. I would recommend starting her on aspirin and Plavix. I would recommend admitting her for an MRI of the head along with MRA of the head and neck. MRA of the neck should be done with fat suppression sequence. Will recommend neurology follow-up. We will also recommend checking lipid panel and hemoglobin A1c. Depending on the results of the MRI further care would be planned. Her right vertebral is completely open and she does not have a total occlusion of the left vert. In my opinion she would not be a candidate for thrombectomy at this point.  CT HEAD: Showed No Acute Hemorrhage or Acute Core Infarct  Our recommendations are outlined below.  Diagnostic Studies: Recommend MRI brain without contrast Routine MRA head without contrast and MRA Neck with contrast  Laboratory Studies: Recommend Lipid panel Hemoglobin A1c  Medication: Initiate dual antiplatelet therapy with Aspirin 81 mg daily and Clopidogrel 75 mg daily Statins for LDL goal less than 70  Nursing Recommendations: Telemetry, IV Fluids, avoid dextrose containing fluids, Maintain euglycemia Neuro checks q4 hrs x 24 hrs and then per shift Head of bed 30 degrees  Consultations: Recommend Speech therapy if failed dysphagia screen Physical therapy/Occupational therapy  DVT Prophylaxis: Choice of Primary Team  Disposition: Neurology will follow   Metrics: [facility] Notification Time: 09/21/2020 22:52:19 Stamp Time: 09/21/2020 22:54:27 Callback Response Time: 09/21/2020 23:05:00   ----------------------------------------------------------------------------------------------------  Chief  Complaint: Vertebral dissection  History of Present Illness: Patient is a 35 year old Female.  Extremely pleasant 35 year old female with no significant past medical history, came to the hospital because of neck pain and blurry vision. She reports she was playing basketball and got hurt playing basketball on Saturday. She was having some headache and neck pain over the weekend. Noticed blurry vision today and came to the hospital. She denies any problem with her speech or swallowing. Denies any visual issues at this point. Denies any focal numbness or tingling. Does report a headache. Denies gait or balance issues.    Past Medical History:      There is NO history of Hypertension      There is NO history of Diabetes Mellitus      There is NO history of Hyperlipidemia      There is NO history of Atrial Fibrillation      There is NO history of Coronary Artery Disease      There is NO history of Stroke      There is NO history of Covid-19  Anticoagulant use:  No  Antiplatelet use: No    Examination: BP(142/104), Pulse(81), Blood Glucose(96) 1A: Level of Consciousness - Alert; keenly responsive + 0 1B: Ask Month and Age - Both Questions Right + 0 1C: Blink Eyes & Squeeze Hands - Performs Both Tasks + 0 2: Test Horizontal Extraocular Movements - Normal + 0 3: Test Visual Fields - No Visual Loss + 0 4: Test Facial Palsy (Use Grimace if Obtunded) - Normal symmetry + 0 5A: Test Left Arm Motor Drift - No Drift for 10 Seconds + 0 5B: Test Right Arm Motor Drift - No Drift for 10 Seconds + 0 6A: Test Left Leg Motor Drift -  No Drift for 5 Seconds + 0 6B: Test Right Leg Motor Drift - No Drift for 5 Seconds + 0 7: Test Limb Ataxia (FNF/Heel-Shin) - No Ataxia + 0 8: Test Sensation - Normal; No sensory loss + 0 9: Test Language/Aphasia - Normal; No aphasia + 0 10: Test Dysarthria - Normal + 0 11: Test Extinction/Inattention - No abnormality + 0  NIHSS Score: 0     Patient / Family was  informed the Neurology Consult would occur via TeleHealth consult by way of interactive audio and video telecommunications and consented to receiving care in this manner.  Patient is being evaluated for possible acute neurologic impairment and high probability of imminent or life - threatening deterioration.I spent total of 28 minutes providing care to this patient, including time for face to face visit via telemedicine, review of medical records, imaging studies and discussion of findings with providers, the patient and / or family.   Dr Faustino Congress   [facility] [phone]  Case [id number]

## 2020-09-22 NOTE — Discharge Summary (Addendum)
Physician Discharge Summary  Laurie Mullins HWE:[id number] DOB: 1986-01-17 DOA: 09/21/2020  PCP: Laurie Arnt, MD  Admit date: 09/21/2020 Discharge date: 09/22/2020  Admitted From: Home Disposition: Home  Recommendations for Outpatient Follow-up:  Follow up with PCP in 1 week  Outpatient follow-up with neurology.  Neurology is recommending activity restriction: Lift no more than 5 pounds till evaluated by neurology as an outpatient Follow up in ED if symptoms worsen or new appear   Home Health: No Equipment/Devices: None  Discharge Condition: Stable CODE STATUS: Full Diet recommendation: Heart healthy  Brief/Interim Summary: 35 year old female with history of melanoma status post excision undergoing 21 presented with blurred vision and neck pain and headache.  On presentation, CT angiogram head and neck showed left vertebral artery dissection.  Teleneurology recommended MRA of head and neck and MRI of brain and formal neurology evaluation.  She was started on aspirin, Plavix and Lipitor.  Symptoms have improved.  Neurology evaluated the patient and recommended to continue aspirin, Plavix and Lipitor and cleared her for discharge.  She will be discharged home today with outpatient follow-up with PCP and neurology.  Discharge Diagnoses:   Left vertebral artery dissection -Patient presented with headache, neck pain and blurred vision and was found to have left vertebral artery dissection on CT angiogram.  MRA of head and neck confirmed the same and MRI of brain was negative for acute stroke. -She has been started on aspirin, Plavix and Lipitor as per teleneurology recommendations. -Symptoms have improved.  Neurology evaluated the patient and recommended to continue aspirin, Plavix and Lipitor and cleared her for discharge.  She will be discharged home today with outpatient follow-up with PCP and neurology. -Neurology is recommending activity restriction: Lift no more than 5 pounds  till evaluated by neurology as an outpatient   Discharge Instructions  Discharge Instructions     Ambulatory referral to Neurology   Complete by: As directed    An appointment is requested in approximately: 1 week Acute vertebral artery dissection      Allergies as of 09/22/2020   No Known Allergies      Medication List     STOP taking these medications    Mirena (52 MG) 20 MCG/DAY Iud Generic drug: levonorgestrel       TAKE these medications    Adult One Daily Gummies Chew Chew 1 tablet by mouth daily.   aspirin 81 MG EC tablet Take 1 tablet (81 mg total) by mouth daily. Swallow whole. Start taking on: September 23, 2020   atorvastatin 40 MG tablet Commonly known as: LIPITOR Take 1 tablet  by mouth daily. Start taking on: September 23, 2020   clopidogrel 75 MG tablet Commonly known as: PLAVIX Take 1 tablet (75 mg total) by mouth daily. Start taking on: September 23, 2020   fluticasone 50 MCG/ACT nasal spray Commonly known as: FLONASE Place 2 sprays into both nostrils daily as needed for allergies.   HYDROcodone-acetaminophen 5-325 MG tablet Commonly known as: NORCO/VICODIN Take 1 tablet by mouth every 6 (six) hours as needed for severe pain.   ibuprofen 200 MG tablet Commonly known as: ADVIL Take 200-400 mg by mouth every 6 (six) hours as needed (for headaches).   naproxen 500 MG tablet Commonly known as: Naprosyn Take 1 tablet (500 mg total) by mouth 2 (two) times daily as needed (for headaches).           Follow-up Information     [facility]. Go to .  Specialty: Emergency Medicine Why: If symptoms worsen Contact information: Laurie Mullins [id number] Laurie Mullins [id number] [phone]        [facility]. Schedule an appointment as soon as possible for a visit .   Contact information: Laurie Mullins, [place]        Laurie Arnt, MD. Schedule an appointment as soon as possible for a visit in 1 week(s).   Specialty: Family Medicine Contact information: [address] [place] [address] [phone]                No Known Allergies  Consultations: Neurology   Procedures/Studies: CT Angio Head W or Wo Contrast  Result Date: 09/21/2020 CLINICAL DATA:  Initial evaluation for stroke, TIA. EXAM: CT ANGIOGRAPHY HEAD AND NECK TECHNIQUE: Multidetector CT imaging of the head and neck was performed using the standard protocol during bolus administration of intravenous contrast. Multiplanar CT image reconstructions and MIPs were obtained to evaluate the vascular anatomy. Carotid stenosis measurements (when applicable) are obtained utilizing NASCET criteria, using the distal internal carotid diameter as the denominator. CONTRAST:  10mL OMNIPAQUE IOHEXOL 350 MG/ML SOLN COMPARISON:  None. FINDINGS: CT HEAD FINDINGS Brain: Cerebral volume within normal limits for patient age. No evidence for acute intracranial hemorrhage. No findings to suggest acute large vessel territory infarct. No mass lesion, midline shift, or mass effect. Ventricles are normal in size without evidence for hydrocephalus. No extra-axial fluid collection identified. Vascular: No hyperdense vessel identified. Skull: Scalp soft tissues demonstrate no acute abnormality. Subcentimeter calcification noted at the left parietal scalp. Calvarium intact. Sinuses/Orbits: Globes and orbital soft tissues within normal limits. Visualized paranasal sinuses are clear. No mastoid effusion. CTA NECK FINDINGS Aortic arch: Visualized aortic arch normal in caliber with normal branch pattern. No stenosis or other abnormality about the origin of the great vessels. Right carotid system: Right common and internal carotid arteries widely patent without stenosis, dissection or occlusion. Left carotid system: Left common and internal carotid arteries widely patent without  stenosis, dissection or occlusion. Vertebral arteries: Both vertebral arteries arise from the subclavian arteries. No proximal subclavian artery stenosis. Right vertebral artery widely patent within the neck without stenosis or other abnormality. On the left, there is an acute arterial dissection involving the left V3/proximal V4 segment, extending from the left transverse process of C1 to just beyond the dural reflection (series 11, image 182). Associated luminal narrowing of up to approximately 50%. Small amount of irregular intraluminal thrombus protrudes into the lumen of the proximal left V4 segment. Skeleton: No visible acute osseous finding. No discrete or worrisome osseous lesions. Other neck: No other acute soft tissue abnormality within the neck. No mass or adenopathy. Upper chest: Unremarkable. Review of the MIP images confirms the above findings CTA HEAD FINDINGS Anterior circulation: Both internal carotid arteries widely patent to the termini without stenosis. A1 segments widely patent. Normal anterior communicating artery complex. Both anterior cerebral arteries widely patent to their distal aspects without stenosis. No M1 stenosis or occlusion. Normal MCA bifurcations. Distal MCA branches well perfused and symmetric. Posterior circulation: Right V4 segment widely patent to the vertebrobasilar junction. Acute dissection involving the proximal left V4 segment with up to 50% stenosis as above. This is greater than 2 cm from the vertebrobasilar junction. Left V4 segment otherwise widely patent distally. Both PICA origins remain patent and normal. Basilar widely patent without abnormality. Superior cerebellar arteries patent bilaterally. Both PCAs primarily supplied via the basilar  well perfused or distal aspects without stenosis. No visible downstream embolic occlusion. Venous sinuses: Patent allowing for timing the contrast bolus. Anatomic variants: None significant.  No aneurysm. Review of the MIP  images confirms the above findings IMPRESSION: 1. Acute dissection involving the left V3/proximal V4 segment, extending from the left transverse process of C1 to just beyond the dural reflection. Associated luminal narrowing of up to approximately 50% with small amount of irregular intraluminal thrombus protruding into the vascular lumen. No visible downstream embolic occlusion. 2. Otherwise negative CTA of the head and neck. No large vessel occlusion. No other hemodynamically significant or correctable stenosis. 3. No other acute intracranial abnormality. Critical Value/emergent results were called by telephone at the time of interpretation on 09/21/2020 at 10:48 pm to provider MATTHEW TRIFAN , who verbally acknowledged these results. Electronically Signed   By: Jeannine Boga M.D.   On: 09/21/2020 22:52   CT Angio Head W or Wo Contrast  Result Date: 09/21/2020 CLINICAL DATA:  Initial evaluation for stroke, TIA. EXAM: CT ANGIOGRAPHY HEAD AND NECK TECHNIQUE: Multidetector CT imaging of the head and neck was performed using the standard protocol during bolus administration of intravenous contrast. Multiplanar CT image reconstructions and MIPs were obtained to evaluate the vascular anatomy. Carotid stenosis measurements (when applicable) are obtained utilizing NASCET criteria, using the distal internal carotid diameter as the denominator. CONTRAST:  49mL OMNIPAQUE IOHEXOL 350 MG/ML SOLN COMPARISON:  None. FINDINGS: CT HEAD FINDINGS Brain: Cerebral volume within normal limits for patient age. No evidence for acute intracranial hemorrhage. No findings to suggest acute large vessel territory infarct. No mass lesion, midline shift, or mass effect. Ventricles are normal in size without evidence for hydrocephalus. No extra-axial fluid collection identified. Vascular: No hyperdense vessel identified. Skull: Scalp soft tissues demonstrate no acute abnormality. Subcentimeter calcification noted at the left parietal  scalp. Calvarium intact. Sinuses/Orbits: Globes and orbital soft tissues within normal limits. Visualized paranasal sinuses are clear. No mastoid effusion. CTA NECK FINDINGS Aortic arch: Visualized aortic arch normal in caliber with normal branch pattern. No stenosis or other abnormality about the origin of the great vessels. Right carotid system: Right common and internal carotid arteries widely patent without stenosis, dissection or occlusion. Left carotid system: Left common and internal carotid arteries widely patent without stenosis, dissection or occlusion. Vertebral arteries: Both vertebral arteries arise from the subclavian arteries. No proximal subclavian artery stenosis. Right vertebral artery widely patent within the neck without stenosis or other abnormality. On the left, there is an acute arterial dissection involving the left V3/proximal V4 segment, extending from the left transverse process of C1 to just beyond the dural reflection (series 11, image 182). Associated luminal narrowing of up to approximately 50%. Small amount of irregular intraluminal thrombus protrudes into the lumen of the proximal left V4 segment. Skeleton: No visible acute osseous finding. No discrete or worrisome osseous lesions. Other neck: No other acute soft tissue abnormality within the neck. No mass or adenopathy. Upper chest: Unremarkable. Review of the MIP images confirms the above findings CTA HEAD FINDINGS Anterior circulation: Both internal carotid arteries widely patent to the termini without stenosis. A1 segments widely patent. Normal anterior communicating artery complex. Both anterior cerebral arteries widely patent to their distal aspects without stenosis. No M1 stenosis or occlusion. Normal MCA bifurcations. Distal MCA branches well perfused and symmetric. Posterior circulation: Right V4 segment widely patent to the vertebrobasilar junction. Acute dissection involving the proximal left V4 segment with up to 50%  stenosis as above. This is  greater than 2 cm from the vertebrobasilar junction. Left V4 segment otherwise widely patent distally. Both PICA origins remain patent and normal. Basilar widely patent without abnormality. Superior cerebellar arteries patent bilaterally. Both PCAs primarily supplied via the basilar well perfused or distal aspects without stenosis. No visible downstream embolic occlusion. Venous sinuses: Patent allowing for timing the contrast bolus. Anatomic variants: None significant.  No aneurysm. Review of the MIP images confirms the above findings IMPRESSION: 1. Acute dissection involving the left V3/proximal V4 segment, extending from the left transverse process of C1 to just beyond the dural reflection. Associated luminal narrowing of up to approximately 50% with small amount of irregular intraluminal thrombus protruding into the vascular lumen. No visible downstream embolic occlusion. 2. Otherwise negative CTA of the head and neck. No large vessel occlusion. No other hemodynamically significant or correctable stenosis. 3. No other acute intracranial abnormality. Critical Value/emergent results were called by telephone at the time of interpretation on 09/21/2020 at 10:48 pm to provider MATTHEW TRIFAN , who verbally acknowledged these results. Electronically Signed   By: Jeannine Boga M.D.   On: 09/21/2020 22:52   MR ANGIO HEAD WO CONTRAST  Result Date: 09/22/2020 CLINICAL DATA:  Acute neuro deficit. Left vertebral artery dissection on CTA. EXAM: MRI HEAD WITHOUT CONTRAST MRA HEAD WITHOUT CONTRAST MRA OF THE NECK WITHOUT AND WITH CONTRAST TECHNIQUE: Multiplanar, multi-echo pulse sequences of the brain and surrounding structures were acquired without intravenous contrast. Angiographic images of the Circle of Willis were acquired using MRA technique without intravenous contrast. Angiographic images of the neck were acquired using MRA technique without and with intravenous contrast. Carotid  stenosis measurements (when applicable) are obtained utilizing NASCET criteria, using the distal internal carotid diameter as the denominator. CONTRAST:  53mL GADAVIST GADOBUTROL 1 MMOL/ML IV SOLN COMPARISON:  CT a head and neck 09/21/2020 FINDINGS: MR HEAD FINDINGS Brain: Negative for acute infarct. Few small white matter hyperintensities including left temporoparietal periventricular white matter hyperintensity. Ventricle size normal. Negative for hemorrhage or mass. Vascular: Normal arterial flow voids. Skull and upper cervical spine: Negative Sinuses/Orbits: Negative Other: None MRA HEAD FINDINGS Anterior circulation: Both vertebral arteries widely patent. Anterior and middle cerebral arteries normal without stenosis, occlusion, or aneurysm. Posterior circulation: CT angiogram demonstrates dissection in the distal left vertebral artery at the skull base. This is best seen on axial source image number 152 with intimal flap and intraluminal thrombus. This is not causing significant luminal narrowing. Distal right vertebral artery normal. PICA patent bilaterally. Basilar widely patent. Superior cerebellar and posterior cerebral arteries patent bilaterally. No aneurysm. Anatomic variants:None MRA NECK FINDINGS Aortic arch: Aortic arch great normal vessels widely patent in caliber. Proximal with standard branching of the arch. Right carotid system: Widely patent right carotid without stenosis dissection or aneurysm Left carotid system: Widely patent left carotid without stenosis dissection or aneurysm. Vertebral arteries: Both vertebral arteries are widely patent to the basilar without significant stenosis. Dissection of the distal left vertebral artery at the skull base is best identified on CT angiogram and also on the MRA head source images Other: None IMPRESSION: 1. Negative for acute infarct. 2. Few small white matter hyperintensities, nonspecific. Differential includes chronic microvascular ischemia possibly  demyelinating disease. 3. Dissection distal left vertebral artery at the skull base is best identified by CT angiogram however is also identified on source images from the MRA head. No significant flow limiting stenosis although there does appear to be some intraluminal thrombus. 4. No significant carotid or vertebral artery stenosis in  the neck. Electronically Signed   By: Franchot Gallo M.D.   On: 09/22/2020 08:07   MR ANGIO NECK W WO CONTRAST  Result Date: 09/22/2020 CLINICAL DATA:  Acute neuro deficit. Left vertebral artery dissection on CTA. EXAM: MRI HEAD WITHOUT CONTRAST MRA HEAD WITHOUT CONTRAST MRA OF THE NECK WITHOUT AND WITH CONTRAST TECHNIQUE: Multiplanar, multi-echo pulse sequences of the brain and surrounding structures were acquired without intravenous contrast. Angiographic images of the Circle of Willis were acquired using MRA technique without intravenous contrast. Angiographic images of the neck were acquired using MRA technique without and with intravenous contrast. Carotid stenosis measurements (when applicable) are obtained utilizing NASCET criteria, using the distal internal carotid diameter as the denominator. CONTRAST:  81mL GADAVIST GADOBUTROL 1 MMOL/ML IV SOLN COMPARISON:  CT a head and neck 09/21/2020 FINDINGS: MR HEAD FINDINGS Brain: Negative for acute infarct. Few small white matter hyperintensities including left temporoparietal periventricular white matter hyperintensity. Ventricle size normal. Negative for hemorrhage or mass. Vascular: Normal arterial flow voids. Skull and upper cervical spine: Negative Sinuses/Orbits: Negative Other: None MRA HEAD FINDINGS Anterior circulation: Both vertebral arteries widely patent. Anterior and middle cerebral arteries normal without stenosis, occlusion, or aneurysm. Posterior circulation: CT angiogram demonstrates dissection in the distal left vertebral artery at the skull base. This is best seen on axial source image number 152 with intimal  flap and intraluminal thrombus. This is not causing significant luminal narrowing. Distal right vertebral artery normal. PICA patent bilaterally. Basilar widely patent. Superior cerebellar and posterior cerebral arteries patent bilaterally. No aneurysm. Anatomic variants:None MRA NECK FINDINGS Aortic arch: Aortic arch great normal vessels widely patent in caliber. Proximal with standard branching of the arch. Right carotid system: Widely patent right carotid without stenosis dissection or aneurysm Left carotid system: Widely patent left carotid without stenosis dissection or aneurysm. Vertebral arteries: Both vertebral arteries are widely patent to the basilar without significant stenosis. Dissection of the distal left vertebral artery at the skull base is best identified on CT angiogram and also on the MRA head source images Other: None IMPRESSION: 1. Negative for acute infarct. 2. Few small white matter hyperintensities, nonspecific. Differential includes chronic microvascular ischemia possibly demyelinating disease. 3. Dissection distal left vertebral artery at the skull base is best identified by CT angiogram however is also identified on source images from the MRA head. No significant flow limiting stenosis although there does appear to be some intraluminal thrombus. 4. No significant carotid or vertebral artery stenosis in the neck. Electronically Signed   By: Franchot Gallo M.D.   On: 09/22/2020 08:07   MR BRAIN WO CONTRAST  Result Date: 09/22/2020 CLINICAL DATA:  Acute neuro deficit. Left vertebral artery dissection on CTA. EXAM: MRI HEAD WITHOUT CONTRAST MRA HEAD WITHOUT CONTRAST MRA OF THE NECK WITHOUT AND WITH CONTRAST TECHNIQUE: Multiplanar, multi-echo pulse sequences of the brain and surrounding structures were acquired without intravenous contrast. Angiographic images of the Circle of Willis were acquired using MRA technique without intravenous contrast. Angiographic images of the neck were  acquired using MRA technique without and with intravenous contrast. Carotid stenosis measurements (when applicable) are obtained utilizing NASCET criteria, using the distal internal carotid diameter as the denominator. CONTRAST:  67mL GADAVIST GADOBUTROL 1 MMOL/ML IV SOLN COMPARISON:  CT a head and neck 09/21/2020 FINDINGS: MR HEAD FINDINGS Brain: Negative for acute infarct. Few small white matter hyperintensities including left temporoparietal periventricular white matter hyperintensity. Ventricle size normal. Negative for hemorrhage or mass. Vascular: Normal arterial flow voids. Skull and upper cervical  spine: Negative Sinuses/Orbits: Negative Other: None MRA HEAD FINDINGS Anterior circulation: Both vertebral arteries widely patent. Anterior and middle cerebral arteries normal without stenosis, occlusion, or aneurysm. Posterior circulation: CT angiogram demonstrates dissection in the distal left vertebral artery at the skull base. This is best seen on axial source image number 152 with intimal flap and intraluminal thrombus. This is not causing significant luminal narrowing. Distal right vertebral artery normal. PICA patent bilaterally. Basilar widely patent. Superior cerebellar and posterior cerebral arteries patent bilaterally. No aneurysm. Anatomic variants:None MRA NECK FINDINGS Aortic arch: Aortic arch great normal vessels widely patent in caliber. Proximal with standard branching of the arch. Right carotid system: Widely patent right carotid without stenosis dissection or aneurysm Left carotid system: Widely patent left carotid without stenosis dissection or aneurysm. Vertebral arteries: Both vertebral arteries are widely patent to the basilar without significant stenosis. Dissection of the distal left vertebral artery at the skull base is best identified on CT angiogram and also on the MRA head source images Other: None IMPRESSION: 1. Negative for acute infarct. 2. Few small white matter hyperintensities,  nonspecific. Differential includes chronic microvascular ischemia possibly demyelinating disease. 3. Dissection distal left vertebral artery at the skull base is best identified by CT angiogram however is also identified on source images from the MRA head. No significant flow limiting stenosis although there does appear to be some intraluminal thrombus. 4. No significant carotid or vertebral artery stenosis in the neck. Electronically Signed   By: Franchot Gallo M.D.   On: 09/22/2020 08:07      Subjective: Patient seen and examined at bedside.  Feels that her symptoms are improving.  Denies worsening headache.  No overnight fever or vomiting reported.  Discharge Exam: Vitals:   09/22/20 1400 09/22/20 1456  BP: 101/65 112/73  Pulse: 65 63  Resp: 15 16  Temp:    SpO2: 93% 96%    General: Pt is alert, awake, not in acute distress Cardiovascular: rate controlled, S1/S2 + Respiratory: bilateral decreased breath sounds at bases Abdominal: Soft, NT, ND, bowel sounds + Extremities: no edema, no cyanosis CNS: Awake and alert.  Moving extremities.  No focal neurologic deficit    The results of significant diagnostics from this hospitalization (including imaging, microbiology, ancillary and laboratory) are listed below for reference.     Microbiology: Recent Results (from the past 240 hour(s))  Resp Panel by RT-PCR (Flu A&B, Covid) Nasopharyngeal Swab     Status: None   Collection Time: 09/22/20  2:38 AM   Specimen: Nasopharyngeal Swab; Nasopharyngeal(NP) swabs in vial transport medium  Result Value Ref Range Status   SARS Coronavirus 2 by RT PCR NEGATIVE NEGATIVE Final    Comment: (NOTE) SARS-CoV-2 target nucleic acids are NOT DETECTED.  The SARS-CoV-2 RNA is generally detectable in upper respiratory specimens during the acute phase of infection. The lowest concentration of SARS-CoV-2 viral copies this assay can detect is 138 copies/mL. A negative result does not preclude  SARS-Cov-2 infection and should not be used as the sole basis for treatment or other patient management decisions. A negative result may occur with  improper specimen collection/handling, submission of specimen other than nasopharyngeal swab, presence of viral mutation(s) within the areas targeted by this assay, and inadequate number of viral copies(<138 copies/mL). A negative result must be combined with clinical observations, patient history, and epidemiological information. The expected result is Negative.  Fact Sheet for Patients:  EntrepreneurPulse.com.au  Fact Sheet for Healthcare Providers:  IncredibleEmployment.be  This test is no t  yet approved or cleared by the [place] and  has been authorized for detection and/or diagnosis of SARS-CoV-2 by FDA under an Emergency Use Authorization (EUA). This EUA will remain  in effect (meaning this test can be used) for the duration of the COVID-19 declaration under Section 564(b)(1) of the Act, 21 U.S.C.section 360bbb-3(b)(1), unless the authorization is terminated  or revoked sooner.       Influenza A by PCR NEGATIVE NEGATIVE Final   Influenza B by PCR NEGATIVE NEGATIVE Final    Comment: (NOTE) The Xpert Xpress SARS-CoV-2/FLU/RSV plus assay is intended as an aid in the diagnosis of influenza from Nasopharyngeal swab specimens and should not be used as a sole basis for treatment. Nasal washings and aspirates are unacceptable for Xpert Xpress SARS-CoV-2/FLU/RSV testing.  Fact Sheet for Patients: EntrepreneurPulse.com.au  Fact Sheet for Healthcare Providers: IncredibleEmployment.be  This test is not yet approved or cleared by the [place] FDA and has been authorized for detection and/or diagnosis of SARS-CoV-2 by FDA under an Emergency Use Authorization (EUA). This EUA will remain in effect (meaning this test can be used) for the duration of  the COVID-19 declaration under Section 564(b)(1) of the Act, 21 U.S.C. section 360bbb-3(b)(1), unless the authorization is terminated or revoked.  Performed at [facility], [place] [address] [place], [place] [address]      Labs: BNP (last 3 results) No results for input(s): BNP in the last 8760 hours. Basic Metabolic Panel: Recent Labs  Lab 09/21/20 1957 09/22/20 0539  NA 138 137  K 3.9 3.9  CL 106 109  CO2 26 25  GLUCOSE 96 111*  BUN 14 12  CREATININE 0.71 0.68  CALCIUM 8.9 8.8*   Liver Function Tests: Recent Labs  Lab 09/22/20 0539  AST 16  ALT 16  ALKPHOS 54  BILITOT 0.7  PROT 6.4*  ALBUMIN 3.4*   No results for input(s): LIPASE, AMYLASE in the last 168 hours. No results for input(s): AMMONIA in the last 168 hours. CBC: Recent Labs  Lab 09/21/20 1957 09/22/20 0539  WBC 8.1 8.9  NEUTROABS 4.6  --   HGB 14.0 13.4  HCT 42.6 40.6  MCV 88.6 88.3  PLT 184 184   Cardiac Enzymes: No results for input(s): CKTOTAL, CKMB, CKMBINDEX, TROPONINI in the last 168 hours. BNP: Invalid input(s): POCBNP CBG: No results for input(s): GLUCAP in the last 168 hours. D-Dimer No results for input(s): DDIMER in the last 72 hours. Hgb A1c Recent Labs    09/22/20 0539  HGBA1C 5.4   Lipid Profile Recent Labs    09/22/20 0539  CHOL 185  HDL 43  LDLCALC 128*  TRIG 69  CHOLHDL 4.3   Thyroid function studies No results for input(s): TSH, T4TOTAL, T3FREE, THYROIDAB in the last 72 hours.  Invalid input(s): FREET3 Anemia work up No results for input(s): VITAMINB12, FOLATE, FERRITIN, TIBC, IRON, RETICCTPCT in the last 72 hours. Urinalysis No results found for: COLORURINE, APPEARANCEUR, [place], [place], GLUCOSEU, [place], [place], [place], PROTEINUR, UROBILINOGEN, NITRITE, LEUKOCYTESUR Sepsis Labs Invalid input(s): PROCALCITONIN,  WBC,  LACTICIDVEN Microbiology Recent Results (from the past 240 hour(s))  Resp Panel by RT-PCR (Flu A&B, Covid)  Nasopharyngeal Swab     Status: None   Collection Time: 09/22/20  2:38 AM   Specimen: Nasopharyngeal Swab; Nasopharyngeal(NP) swabs in vial transport medium  Result Value Ref Range Status   SARS Coronavirus 2 by RT PCR NEGATIVE NEGATIVE Final    Comment: (NOTE) SARS-CoV-2 target nucleic acids are NOT  DETECTED.  The SARS-CoV-2 RNA is generally detectable in upper respiratory specimens during the acute phase of infection. The lowest concentration of SARS-CoV-2 viral copies this assay can detect is 138 copies/mL. A negative result does not preclude SARS-Cov-2 infection and should not be used as the sole basis for treatment or other patient management decisions. A negative result may occur with  improper specimen collection/handling, submission of specimen other than nasopharyngeal swab, presence of viral mutation(s) within the areas targeted by this assay, and inadequate number of viral copies(<138 copies/mL). A negative result must be combined with clinical observations, patient history, and epidemiological information. The expected result is Negative.  Fact Sheet for Patients:  EntrepreneurPulse.com.au  Fact Sheet for Healthcare Providers:  IncredibleEmployment.be  This test is no t yet approved or cleared by the [place] FDA and  has been authorized for detection and/or diagnosis of SARS-CoV-2 by FDA under an Emergency Use Authorization (EUA). This EUA will remain  in effect (meaning this test can be used) for the duration of the COVID-19 declaration under Section 564(b)(1) of the Act, 21 U.S.C.section 360bbb-3(b)(1), unless the authorization is terminated  or revoked sooner.       Influenza A by PCR NEGATIVE NEGATIVE Final   Influenza B by PCR NEGATIVE NEGATIVE Final    Comment: (NOTE) The Xpert Xpress SARS-CoV-2/FLU/RSV plus assay is intended as an aid in the diagnosis of influenza from Nasopharyngeal swab specimens and should not be  used as a sole basis for treatment. Nasal washings and aspirates are unacceptable for Xpert Xpress SARS-CoV-2/FLU/RSV testing.  Fact Sheet for Patients: EntrepreneurPulse.com.au  Fact Sheet for Healthcare Providers: IncredibleEmployment.be  This test is not yet approved or cleared by the [place] FDA and has been authorized for detection and/or diagnosis of SARS-CoV-2 by FDA under an Emergency Use Authorization (EUA). This EUA will remain in effect (meaning this test can be used) for the duration of the COVID-19 declaration under Section 564(b)(1) of the Act, 21 U.S.C. section 360bbb-3(b)(1), unless the authorization is terminated or revoked.  Performed at [facility], [place] [address], [place], [place] [address]      Time coordinating discharge: 35 minutes  SIGNED:   Aline August, MD  [facility] 09/22/2020, 3:05 PM

## 2020-09-22 NOTE — Progress Notes (Signed)
PT Cancellation Note  Patient Details Name: Laurie Mullins MRN: [id number] DOB: 08/23/1985   Cancelled Treatment:     Reason Eval/Treat Not Completed: PT screened, no needs identified, will sign off. Patient reports no physical deficits nor sensatory impairments. Vision is currently normal. Patient able to ambulate and perform ADLs. PT evaluation not indicate at this time   [facility] 09/22/2020, 12:48 PM

## 2020-09-22 NOTE — Consult Note (Signed)
Neurology Consultation  Reason for Consult: VAD Referring Physician:   CC: HA, Left neck pain, and blurred vision.   History is obtained from: Patient  HPI: Laurie Mullins is a 35 y.o. female with a non significant PMHx. 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   Patient first noticed some neck pain on Friday morning and on extensive questioning notes that she did get her hair cut on Thursday with her neck extended into a salon basin to be washed. Patient states she was playing basketball last Saturday and got hurt. The next morning, she awoke with more severe neck pain that she thought was just muscular. She also had a HA that was very severe on Sunday morning. Her HA was somewhat relieved by Advil, but the medicine would only work for [employer] before her intense HA returned. Her neck pain continued but was not intense. 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 She also relayed that she had some "kaleidoscope" vision, but this is resolved now --when pressed she reports that parts of it were blurry when she was trying to read text and it was very difficult for her to make out words. She is only having blurry vision when trying to read close to her face. Other than that, no continued visual disturbances.   No weakness of a particular limb, no dysphagia, dysarthria, numbness, tinging, or facial droop. She was seen by tele Neuro last pm and diagnosed with Left Vertebral Artery Dissection. She was placed on ASA and Plavix and has undergone stroke workup. Patient denies history of GIB, blood in her stools, or hematuria.   MRI without stroke finding. MRA head and neck with both vertebrals widely patent, but CTA head showed dissection of the LVA at skull base without significant luminal narrowing. No flow limiting stenosis but there is a small intraluminal thrombus. All other intracranial vessels were normal.    Patient's HA and neck pain has dissipated. She is having no other symptoms that suggest acute stroke. She has no risk factors for stroke.   Neurology asked to consult for VAD.   ROS: A robust ROS was performed and is negative except as noted in the HPI.   Past Medical History:  Diagnosis Date   Atypical mole 01/01/2018   moderate atypia - right calf   Atypical mole 01/01/2018   moderate atypia - right buttock   Atypical mole 01/01/2018   moderate atypia - right upper back   Atypical mole 04/17/2018   mild atypia - left mid paraspinal   Atypical mole 04/17/2018   mild atypia - right mid paraspinal   Atypical mole 04/17/2018   mild atypia - right mid back   Atypical mole 04/17/2018   moderate atypia - left upper back   Atypical mole 11/03/2018   mild atypia - mid spine   Atypical mole 11/03/2018   mild atypia - right outer thigh   Atypical mole 11/03/2018   moderate atypia - left outer forearm   Atypical mole 11/03/2018   moderate atypia - under left breast   Atypical mole 05/12/2019   moderate to severe - right chest (widershave)   Cystic acne 11/29/2017   History of chicken pox    History of melanoma 09/22/2020   Melanoma ([place]) 01/01/2018   melanoma in situ on left side back - excision with margins free   Melanoma in situ ([place]) 05/12/2019  residual MIS on left side back - excision done 06/11/2019     Family History  Problem Relation Age of Onset   Hypertension Mother    Kidney Stones Mother    Diabetes Paternal Grandmother    COPD Paternal Grandmother    Heart disease Paternal Grandmother    Cancer Paternal Grandmother    Heart attack Maternal Grandmother    Hyperlipidemia Maternal Grandmother    Heart attack Maternal Grandfather    Heart disease Maternal Grandfather    Cancer Paternal Grandfather    Healthy Son    Healthy Son     Social History:   reports that she has never smoked. She has never used smokeless tobacco. She reports that she does not  drink alcohol and does not use drugs.  Medications  Current Facility-Administered Medications:     stroke: mapping our early stages of recovery book, , Does not apply, Once, Shalhoub, Sherryll Burger, MD   acetaminophen (TYLENOL) tablet 650 mg, 650 mg, Oral, Q4H PRN **OR** acetaminophen (TYLENOL) 160 MG/5ML solution 650 mg, 650 mg, Per Tube, Q4H PRN **OR** acetaminophen (TYLENOL) suppository 650 mg, 650 mg, Rectal, Q4H PRN, Shalhoub, Sherryll Burger, MD   aspirin EC tablet 81 mg, 81 mg, Oral, Daily, Shalhoub, Sherryll Burger, MD, 81 mg at 09/22/20 0434   atorvastatin (LIPITOR) tablet 40 mg, 40 mg, Oral, Daily, Kirby-Graham, Karsten Fells, NP, 40 mg at 09/22/20 1017   clopidogrel (PLAVIX) tablet 75 mg, 75 mg, Oral, Daily, Shalhoub, Sherryll Burger, MD, 75 mg at 09/22/20 0434   enoxaparin (LOVENOX) injection 40 mg, 40 mg, Subcutaneous, Q24H, Shalhoub, Sherryll Burger, MD, 40 mg at 09/22/20 1011   fluticasone (FLONASE) 50 MCG/ACT nasal spray 2 spray, 2 spray, Each Nare, Daily PRN, Shalhoub, Sherryll Burger, MD   HYDROcodone-acetaminophen (NORCO/VICODIN) 5-325 MG per tablet 1 tablet, 1 tablet, Oral, Q6H PRN, Shalhoub, Sherryll Burger, MD, 1 tablet at 09/22/20 0437   ondansetron (ZOFRAN) injection 4 mg, 4 mg, Intravenous, Q6H PRN, Shalhoub, Sherryll Burger, MD   polyethylene glycol (MIRALAX / GLYCOLAX) packet 17 g, 17 g, Oral, Daily PRN, Shalhoub, Sherryll Burger, MD  Current Outpatient Medications:    fluticasone (FLONASE) 50 MCG/ACT nasal spray, Place 2 sprays into both nostrils daily. (Patient taking differently: Place 2 sprays into both nostrils daily as needed for allergies.), Disp: 16 g, Rfl: 6   ibuprofen (ADVIL) 200 MG tablet, Take 200-400 mg by mouth every 6 (six) hours as needed (for headaches)., Disp: , Rfl:    Multiple Vitamins-Minerals (ADULT ONE DAILY GUMMIES) CHEW, Chew 1 tablet by mouth daily., Disp: , Rfl:    naproxen (NAPROSYN) 500 MG tablet, Take 1 tablet (500 mg total) by mouth 2 (two) times daily with a meal. (Patient taking differently: Take 500  mg by mouth 2 (two) times daily as needed (for headaches).), Disp: 30 tablet, Rfl: 0   levonorgestrel (MIRENA, 52 MG,) 20 MCG/24HR IUD, Mirena 20 mcg/24 hours (6 yrs) 52 mg intrauterine device  Take 1 device by intrauterine route. (Patient not taking: Reported on 09/21/2020), Disp: , Rfl:    Exam: Current vital signs: BP 128/85   Pulse 87   Temp 98.2 F (36.8 C) (Oral)   Resp 16   Ht 5' 8.5" (1.74 m)   Wt 99.8 kg   SpO2 97%   BMI 32.96 kg/m  Vital signs in last 24 hours: Temp:  [98.1 F (36.7 C)-98.2 F (36.8 C)] 98.2 F (36.8 C) (07/14 0811) Pulse Rate:  [54-87] 87 (07/14 1200) Resp:  [16-22]  16 (07/14 1200) BP: (81-142)/(50-86) 128/85 (07/14 1200) SpO2:  [91 %-100 %] 97 % (07/14 1200) Weight:  [99.8 kg] 99.8 kg (07/13 1936)  PE: GENERAL: Very well appearing female. Awake, alert in NAD.  HEENT: normocephalic and atraumatic. LUNGS - Normal respiratory effort.  CV - RRR on tele. ABDOMEN - Soft, nontender. Ext: warm, well perfused. Psych: affect light.   NEURO:  Mental Status: Alert and oriented x3.  Speech/Language: speech is without dysarthria or aphasia.  Naming, repetition, fluency, and comprehension intact.  Cranial Nerves:  II: PERRL   5 mm/brisk. visual fields full.  III, IV, VI: EOMI. Lid elevation symmetric and full.  V: sensation is intact and symmetrical to face.  VII: Smile is symmetrical.   VIII:hearing intact to voice. IX, X: palate elevation is symmetric. Phonation normal.  XI: normal sternocleidomastoid and trapezius muscle strength. HKV:QQVZDG is symmetrical without fasciculations.   Motor:  Strength is 5/5 in all muscle groups tested.  Tone is normal. Bulk is normal.  Sensation- Intact to light touch bilaterally in all four extremities. Extinction absent to light touch to DSS.  Coordination: FTN intact bilaterally. HKS intact bilaterally. No pronator drift.  DTRs:  2+ throughout.  Gait- deferred.  Labs I have reviewed labs in epic and the  results pertinent to this consultation are:  CBC    Component Value Date/Time   WBC 8.9 09/22/2020 0539   RBC 4.60 09/22/2020 0539   HGB 13.4 09/22/2020 0539   HCT 40.6 09/22/2020 0539   PLT 184 09/22/2020 0539   MCV 88.3 09/22/2020 0539   MCH 29.1 09/22/2020 0539   MCHC 33.0 09/22/2020 0539   RDW 12.4 09/22/2020 0539   LYMPHSABS 2.8 09/21/2020 1957   MONOABS 0.5 09/21/2020 1957   EOSABS 0.1 09/21/2020 1957   BASOSABS 0.0 09/21/2020 1957   CMP     Component Value Date/Time   NA 137 09/22/2020 0539   K 3.9 09/22/2020 0539   CL 109 09/22/2020 0539   CO2 25 09/22/2020 0539   GLUCOSE 111 (H) 09/22/2020 0539   BUN 12 09/22/2020 0539   CREATININE 0.68 09/22/2020 0539   CALCIUM 8.8 (L) 09/22/2020 0539   PROT 6.4 (L) 09/22/2020 0539   ALBUMIN 3.4 (L) 09/22/2020 0539   AST 16 09/22/2020 0539   ALT 16 09/22/2020 0539   ALKPHOS 54 09/22/2020 0539   BILITOT 0.7 09/22/2020 0539   GFRNONAA >60 09/22/2020 0539   Lipid Panel     Component Value Date/Time   CHOL 185 09/22/2020 0539   TRIG 69 09/22/2020 0539   HDL 43 09/22/2020 0539   CHOLHDL 4.3 09/22/2020 0539   VLDL 14 09/22/2020 0539   LDLCALC 128 (H) 09/22/2020 0539   Imaging MD reviewed the images obtained. CTA head and neck. -Acute dissection involving the left V3/proximal V4 segment, extending from the left transverse process of C1 to just beyond the dural reflection. Associated luminal narrowing of up to approximately 50% with small amount of irregular intraluminal thrombus protruding into the vascular lumen. No visible downstream embolic occlusion. -Otherwise negative CTA of the head and neck. No large vessel occlusion. No other hemodynamically significant or correctable stenosis. -No other acute intracranial abnormality.  MRI brain/MRA head and neck.  -Negative for acute infarct. -Few small white matter hyperintensities, nonspecific. Differential includes chronic microvascular ischemia possibly demyelinating  disease. -Dissection distal left vertebral artery at the skull base is best identified by CT angiogram however is also identified on source images from the MRA head. No significant  flow limiting stenosis although there does appear to be some intraluminal thrombus. -No significant carotid or vertebral artery stenosis in the neck.  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 She was found to have a left VAD. She has no risk factors for stroke, but her LDL was over 70 which needs to be treated for secondary stroke prevention.Tele Neuro started her on Plavix and ASA.   Attending physician and patient had a long discussion about risks and benefits of dual antiplatelet therapy versus anticoagulation.  This was also discussed with Dr. Erlinda Hong, who reviewed the patient's CTA as well.  There is clinical equipoise for both treatments given no strong data though in the acute setting with intraluminal thrombus typically anticoagulation is used, her intraluminal thrombus is very small and her neurological symptoms have been minimal.  In a shared decision-making discussion, decision was made to continue dual antiplatelet therapy at this time since patient has been tolerating this well in the ED without recurrence of her symptoms.  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  Impression: -left VAD.  -LDL over 70.   Recommendations/Plan:  -Continue Plavix 75mg  po and ASA 81mg  po and at discharge; full 300 mg loading dose was completed by ordering an additional dose of 225 mg in the ED -Given worsening of symptoms at physical activity and risk for worsening dissection, patient should lift no more than 5 pounds until outpatient follow-up -High intensity statin started. Atorvastatin 40mg  po qd. Her lipid panel can be followed by her PCP.  Goal LDL less than  70 -Counseled patient about being cognizant about medicine regimen because of her risks for stroke.  -F/up appointment referral with out patient neuro placed by ED MD; then canceled and placed by neurology MD specifically for follow-up with Dr. Leonie Man at [facility] neurology  Pt seen by Clance Boll, NP/Neuro and later by MD. Note/plan to be edited by MD as needed.  Pager: [phone]  Attending Neurologist's note:  I personally saw this patient, gathering history, performing a full neurologic examination, reviewing relevant labs, personally reviewing relevant imaging including CTA, MRI brain and MRA, and formulated the assessment and plan, adding the note above for completeness and clarity to accurately reflect my thoughts  Lesleigh Noe MD-PhD [facility] [phone] Available 7 AM to 7 PM, outside these hours please contact Neurologist on call listed on AMION

## 2020-09-22 NOTE — H&P (Signed)
History and Physical    Candies Palm KDT:[id number] DOB: [birth year] DOA: 09/21/2020  PCP: Leamon Arnt, MD  Patient coming from: Home   Chief Complaint:  Chief Complaint  Patient presents with   Blurred Vision   Neck Pain     HPI:    35 year old female with past medical history of melanoma status post excision 2021 who presents to [facility] emergency department due to complaints of headache, neck pain and blurry vision.  Patient explains that on Friday morning she began to feel some mild neck discomfort.  She explains that on the preceding Wednesday she had just joined a new women's basketball league and had begun practicing/working out with them as a result.  On Saturday, patient played an entire 3 on 3 basketball game without relief/rest.  During this game patient states that she exerted herself quite a bit and was even knocked to the ground on at least 1 occasion.  On Saturday evening and going into Sunday morning the patient began to develop severe neck pain, sharp in quality and associated with severe frontal headache.  Patient's symptoms were unrelenting for the first 3 days despite taking doses of as needed NSAIDs and Tylenol.  Patient denies any focal neurologic symptoms at this point.  Patient states that by the time she was to report to work on Wednesday (patient is an Therapist, sports at [employer]) her headache and neck pain began to subside slightly but she began to develop transient blurry vision stating "everything looked like a kaleidoscope."  Symptoms began close to 7 PM.  Patient denies any associated slurring of speech, loss of balance or focal weakness.  This new onset of neurologic symptoms prompted the patient to present to [facility] emergency department for evaluation.  Upon evaluation in the emergency department CT angiogram of the head and neck revealed a left vertebral artery dissection.  Patient was then evaluated by Dr. Mike Craze with teleneurology  who recommended initiation of aspirin and Plavix as well as hospitalization and for work-up including MRA  of the head and neck.  The hospitalist group was then called to assess the patient for admission to the hospital.  Review of Systems:   Review of Systems  Gastrointestinal:  Positive for nausea.  Musculoskeletal:  Positive for neck pain.  Neurological:  Positive for headaches.   Past Medical History:  Diagnosis Date   Atypical mole 01/01/2018   moderate atypia - right calf   Atypical mole 01/01/2018   moderate atypia - right buttock   Atypical mole 01/01/2018   moderate atypia - right upper back   Atypical mole 04/17/2018   mild atypia - left mid paraspinal   Atypical mole 04/17/2018   mild atypia - right mid paraspinal   Atypical mole 04/17/2018   mild atypia - right mid back   Atypical mole 04/17/2018   moderate atypia - left upper back   Atypical mole 11/03/2018   mild atypia - mid spine   Atypical mole 11/03/2018   mild atypia - right outer thigh   Atypical mole 11/03/2018   moderate atypia - left outer forearm   Atypical mole 11/03/2018   moderate atypia - under left breast   Atypical mole 05/12/2019   moderate to severe - right chest (widershave)   Cystic acne 11/29/2017   History of chicken pox    History of melanoma 09/22/2020   Melanoma ([place]) 01/01/2018   melanoma in situ on left side back - excision with margins free  Melanoma in situ ([place]) 05/12/2019   residual MIS on left side back - excision done 06/11/2019    Past Surgical History:  Procedure Laterality Date   DILATION AND EVACUATION Bilateral 03/26/2013   Procedure: DILATATION AND EVACUATION;  Surgeon: Daria Pastures, MD;  Location: [facility];  Service: Gynecology;  Laterality: Bilateral;   WISDOM TOOTH EXTRACTION     WISDOM TOOTH EXTRACTION       reports that she has never smoked. She has never used smokeless tobacco. She reports that she does not drink alcohol and does not use drugs.  No  Known Allergies  Family History  Problem Relation Age of Onset   Hypertension Mother    Kidney Stones Mother    Diabetes Paternal Grandmother    COPD Paternal Grandmother    Heart disease Paternal Grandmother    Cancer Paternal Grandmother    Heart attack Maternal Grandmother    Hyperlipidemia Maternal Grandmother    Heart attack Maternal Grandfather    Heart disease Maternal Grandfather    Cancer Paternal Grandfather    Healthy Son    Healthy Son      Prior to Admission medications   Medication Sig Start Date End Date Taking? Authorizing Provider  fluticasone (FLONASE) 50 MCG/ACT nasal spray Place 2 sprays into both nostrils daily. Patient taking differently: Place 2 sprays into both nostrils daily as needed for allergies. 04/17/20  Yes Hawks, Christy A, FNP  ibuprofen (ADVIL) 200 MG tablet Take 200-400 mg by mouth every 6 (six) hours as needed (for headaches).   Yes [provider]  Multiple Vitamins-Minerals (ADULT ONE DAILY GUMMIES) CHEW Chew 1 tablet by mouth daily.   Yes [provider]  naproxen (NAPROSYN) 500 MG tablet Take 1 tablet (500 mg total) by mouth 2 (two) times daily with a meal. Patient taking differently: Take 500 mg by mouth 2 (two) times daily as needed (for headaches). 04/17/20  Yes Hawks, Christy A, FNP  levonorgestrel (MIRENA, 52 MG,) 20 MCG/24HR IUD Mirena 20 mcg/24 hours (6 yrs) 52 mg intrauterine device  Take 1 device by intrauterine route. Patient not taking: Reported on 09/21/2020    [provider]    Physical Exam: Vitals:   09/21/20 2230 09/21/20 2245 09/22/20 0100 09/22/20 0115  BP: (!) 104/58 118/66 101/73 113/70  Pulse: 66 64 66 (!) 58  Resp:  20  20  Temp:      TempSrc:      SpO2: 99% 98% 99% 96%  Weight:      Height:        Constitutional: Awake alert and oriented x3, no associated distress.   Skin: no rashes, no lesions, good skin turgor noted. Eyes: Pupils are equally reactive to light.  No evidence of  scleral icterus or conjunctival pallor.  ENMT: Moist mucous membranes noted.  Posterior pharynx clear of any exudate or lesions.   Neck: normal, supple, no masses, no thyromegaly.  No evidence of jugular venous distension.   Respiratory: clear to auscultation bilaterally, no wheezing, no crackles. Normal respiratory effort. No accessory muscle use.  Cardiovascular: Regular rate and rhythm, no murmurs / rubs / gallops. No extremity edema. 2+ pedal pulses. No carotid bruits.  Chest:   Nontender without crepitus or deformity.   Back:   Nontender without crepitus or deformity. Abdomen: Abdomen is soft and nontender.  No evidence of intra-abdominal masses.  Positive bowel sounds noted in all quadrants.   Musculoskeletal: No joint deformity upper and lower extremities. Good ROM, no  contractures. Normal muscle tone.  Neurologic: CN 2-12 grossly intact. Sensation intact.  Patient moving all 4 extremities spontaneously.  Patient is following all commands.  Patient is responsive to verbal stimuli.   Psychiatric: Patient exhibits normal mood with appropriate affect.  Patient seems to possess insight as to their current situation.     Labs on Admission: I have personally reviewed following labs and imaging studies -   CBC: Recent Labs  Lab 09/21/20 1957  WBC 8.1  NEUTROABS 4.6  HGB 14.0  HCT 42.6  MCV 88.6  PLT 829   Basic Metabolic Panel: Recent Labs  Lab 09/21/20 1957  NA 138  K 3.9  CL 106  CO2 26  GLUCOSE 96  BUN 14  CREATININE 0.71  CALCIUM 8.9   GFR: Estimated Creatinine Clearance: 121.2 mL/min (by C-G formula based on SCr of 0.71 mg/dL). Liver Function Tests: No results for input(s): AST, ALT, ALKPHOS, BILITOT, PROT, ALBUMIN in the last 168 hours. No results for input(s): LIPASE, AMYLASE in the last 168 hours. No results for input(s): AMMONIA in the last 168 hours. Coagulation Profile: No results for input(s): INR, PROTIME in the last 168 hours. Cardiac Enzymes: No results  for input(s): CKTOTAL, CKMB, CKMBINDEX, TROPONINI in the last 168 hours. BNP (last 3 results) No results for input(s): PROBNP in the last 8760 hours. HbA1C: No results for input(s): HGBA1C in the last 72 hours. CBG: No results for input(s): GLUCAP in the last 168 hours. Lipid Profile: No results for input(s): CHOL, HDL, LDLCALC, TRIG, CHOLHDL, LDLDIRECT in the last 72 hours. Thyroid Function Tests: No results for input(s): TSH, T4TOTAL, FREET4, T3FREE, THYROIDAB in the last 72 hours. Anemia Panel: No results for input(s): VITAMINB12, FOLATE, FERRITIN, TIBC, IRON, RETICCTPCT in the last 72 hours. Urine analysis: No results found for: COLORURINE, APPEARANCEUR, LABSPEC, [place], GLUCOSEU, HGBUR, BILIRUBINUR, KETONESUR, PROTEINUR, UROBILINOGEN, NITRITE, LEUKOCYTESUR  Radiological Exams on Admission - Personally Reviewed: CT Angio Head W or Wo Contrast  Result Date: 09/21/2020 CLINICAL DATA:  Initial evaluation for stroke, TIA. EXAM: CT ANGIOGRAPHY HEAD AND NECK TECHNIQUE: Multidetector CT imaging of the head and neck was performed using the standard protocol during bolus administration of intravenous contrast. Multiplanar CT image reconstructions and MIPs were obtained to evaluate the vascular anatomy. Carotid stenosis measurements (when applicable) are obtained utilizing NASCET criteria, using the distal internal carotid diameter as the denominator. CONTRAST:  92mL OMNIPAQUE IOHEXOL 350 MG/ML SOLN COMPARISON:  None. FINDINGS: CT HEAD FINDINGS Brain: Cerebral volume within normal limits for patient age. No evidence for acute intracranial hemorrhage. No findings to suggest acute large vessel territory infarct. No mass lesion, midline shift, or mass effect. Ventricles are normal in size without evidence for hydrocephalus. No extra-axial fluid collection identified. Vascular: No hyperdense vessel identified. Skull: Scalp soft tissues demonstrate no acute abnormality. Subcentimeter calcification noted at  the left parietal scalp. Calvarium intact. Sinuses/Orbits: Globes and orbital soft tissues within normal limits. Visualized paranasal sinuses are clear. No mastoid effusion. CTA NECK FINDINGS Aortic arch: Visualized aortic arch normal in caliber with normal branch pattern. No stenosis or other abnormality about the origin of the great vessels. Right carotid system: Right common and internal carotid arteries widely patent without stenosis, dissection or occlusion. Left carotid system: Left common and internal carotid arteries widely patent without stenosis, dissection or occlusion. Vertebral arteries: Both vertebral arteries arise from the subclavian arteries. No proximal subclavian artery stenosis. Right vertebral artery widely patent within the neck without stenosis or other abnormality. On the left,  there is an acute arterial dissection involving the left V3/proximal V4 segment, extending from the left transverse process of C1 to just beyond the dural reflection (series 11, image 182). Associated luminal narrowing of up to approximately 50%. Small amount of irregular intraluminal thrombus protrudes into the lumen of the proximal left V4 segment. Skeleton: No visible acute osseous finding. No discrete or worrisome osseous lesions. Other neck: No other acute soft tissue abnormality within the neck. No mass or adenopathy. Upper chest: Unremarkable. Review of the MIP images confirms the above findings CTA HEAD FINDINGS Anterior circulation: Both internal carotid arteries widely patent to the termini without stenosis. A1 segments widely patent. Normal anterior communicating artery complex. Both anterior cerebral arteries widely patent to their distal aspects without stenosis. No M1 stenosis or occlusion. Normal MCA bifurcations. Distal MCA branches well perfused and symmetric. Posterior circulation: Right V4 segment widely patent to the vertebrobasilar junction. Acute dissection involving the proximal left V4 segment  with up to 50% stenosis as above. This is greater than 2 cm from the vertebrobasilar junction. Left V4 segment otherwise widely patent distally. Both PICA origins remain patent and normal. Basilar widely patent without abnormality. Superior cerebellar arteries patent bilaterally. Both PCAs primarily supplied via the basilar well perfused or distal aspects without stenosis. No visible downstream embolic occlusion. Venous sinuses: Patent allowing for timing the contrast bolus. Anatomic variants: None significant.  No aneurysm. Review of the MIP images confirms the above findings IMPRESSION: 1. Acute dissection involving the left V3/proximal V4 segment, extending from the left transverse process of C1 to just beyond the dural reflection. Associated luminal narrowing of up to approximately 50% with small amount of irregular intraluminal thrombus protruding into the vascular lumen. No visible downstream embolic occlusion. 2. Otherwise negative CTA of the head and neck. No large vessel occlusion. No other hemodynamically significant or correctable stenosis. 3. No other acute intracranial abnormality. Critical Value/emergent results were called by telephone at the time of interpretation on 09/21/2020 at 10:48 pm to provider MATTHEW TRIFAN , who verbally acknowledged these results. Electronically Signed   By: Jeannine Boga M.D.   On: 09/21/2020 22:52   CT Angio Head W or Wo Contrast  Result Date: 09/21/2020 CLINICAL DATA:  Initial evaluation for stroke, TIA. EXAM: CT ANGIOGRAPHY HEAD AND NECK TECHNIQUE: Multidetector CT imaging of the head and neck was performed using the standard protocol during bolus administration of intravenous contrast. Multiplanar CT image reconstructions and MIPs were obtained to evaluate the vascular anatomy. Carotid stenosis measurements (when applicable) are obtained utilizing NASCET criteria, using the distal internal carotid diameter as the denominator. CONTRAST:  52mL OMNIPAQUE  IOHEXOL 350 MG/ML SOLN COMPARISON:  None. FINDINGS: CT HEAD FINDINGS Brain: Cerebral volume within normal limits for patient age. No evidence for acute intracranial hemorrhage. No findings to suggest acute large vessel territory infarct. No mass lesion, midline shift, or mass effect. Ventricles are normal in size without evidence for hydrocephalus. No extra-axial fluid collection identified. Vascular: No hyperdense vessel identified. Skull: Scalp soft tissues demonstrate no acute abnormality. Subcentimeter calcification noted at the left parietal scalp. Calvarium intact. Sinuses/Orbits: Globes and orbital soft tissues within normal limits. Visualized paranasal sinuses are clear. No mastoid effusion. CTA NECK FINDINGS Aortic arch: Visualized aortic arch normal in caliber with normal branch pattern. No stenosis or other abnormality about the origin of the great vessels. Right carotid system: Right common and internal carotid arteries widely patent without stenosis, dissection or occlusion. Left carotid system: Left common and internal carotid arteries  widely patent without stenosis, dissection or occlusion. Vertebral arteries: Both vertebral arteries arise from the subclavian arteries. No proximal subclavian artery stenosis. Right vertebral artery widely patent within the neck without stenosis or other abnormality. On the left, there is an acute arterial dissection involving the left V3/proximal V4 segment, extending from the left transverse process of C1 to just beyond the dural reflection (series 11, image 182). Associated luminal narrowing of up to approximately 50%. Small amount of irregular intraluminal thrombus protrudes into the lumen of the proximal left V4 segment. Skeleton: No visible acute osseous finding. No discrete or worrisome osseous lesions. Other neck: No other acute soft tissue abnormality within the neck. No mass or adenopathy. Upper chest: Unremarkable. Review of the MIP images confirms the above  findings CTA HEAD FINDINGS Anterior circulation: Both internal carotid arteries widely patent to the termini without stenosis. A1 segments widely patent. Normal anterior communicating artery complex. Both anterior cerebral arteries widely patent to their distal aspects without stenosis. No M1 stenosis or occlusion. Normal MCA bifurcations. Distal MCA branches well perfused and symmetric. Posterior circulation: Right V4 segment widely patent to the vertebrobasilar junction. Acute dissection involving the proximal left V4 segment with up to 50% stenosis as above. This is greater than 2 cm from the vertebrobasilar junction. Left V4 segment otherwise widely patent distally. Both PICA origins remain patent and normal. Basilar widely patent without abnormality. Superior cerebellar arteries patent bilaterally. Both PCAs primarily supplied via the basilar well perfused or distal aspects without stenosis. No visible downstream embolic occlusion. Venous sinuses: Patent allowing for timing the contrast bolus. Anatomic variants: None significant.  No aneurysm. Review of the MIP images confirms the above findings IMPRESSION: 1. Acute dissection involving the left V3/proximal V4 segment, extending from the left transverse process of C1 to just beyond the dural reflection. Associated luminal narrowing of up to approximately 50% with small amount of irregular intraluminal thrombus protruding into the vascular lumen. No visible downstream embolic occlusion. 2. Otherwise negative CTA of the head and neck. No large vessel occlusion. No other hemodynamically significant or correctable stenosis. 3. No other acute intracranial abnormality. Critical Value/emergent results were called by telephone at the time of interpretation on 09/21/2020 at 10:48 pm to provider MATTHEW TRIFAN , who verbally acknowledged these results. Electronically Signed   By: Jeannine Boga M.D.   On: 09/21/2020 22:52    Telemetry: Personally reviewed.  Rhythm  is normal sinus rhythm with heart rate of 70 bpm.    Assessment/Plan Principal Problem:   Vertebral artery dissection [facility])  Patient presenting with nearly 4-day history of severe headache and neck pain with transient nausea On the date of presentation patient additionally began to exhibit waxing and waning changes in vision CTA of the head neck revealing findings consistent with left vertebral artery dissection with small amount of irregular intraluminal thrombus protruding into the vascular lumen. Etiology may have been secondary to patient's intense exercise and being knocked to the ground during a basketball game on Saturday Patient has been evaluated by Dr. Mike Craze with teleneurology, their input is appreciated. Initiating aspirin and Plavix dual antiplatelet therapy Monitoring patient on telemetry Serial neurologic checks Obtaining lipid panel in the morning and if LDL less than 70 will initiate statin therapy Obtaining MRA of the head and neck per neurology recommendations Obtaining MRI of the brain and if there is evidence of a stroke we will follow-up with an order for an echocardiogram. PT, OT, SLP evaluation ordered.    Code Status:  Full  code Family Communication: deferred   Status is: Observation  The patient remains OBS appropriate and will d/c before 2 midnights.  Dispo: The patient is from: Home              Anticipated d/c is to: Home              Patient currently is not medically stable to d/c.   Difficult to place patient No        Vernelle Emerald MD [facility] Pager [phone]  If 7PM-7AM, please contact night-coverage [email or web address] Use universal [place] password for that web site. If you do not have the password, please call the hospital operator.  09/22/2020, 3:40 AM

## 2020-09-22 NOTE — Progress Notes (Signed)
OT Cancellation Note  Patient Details Name: Aliany Fiorenza MRN: [id number] DOB: 05/30/1985   Cancelled Treatment:    Reason Eval/Treat Not Completed: OT screened, no needs identified, will sign off. Patient reports no physical deficits nor sensatory impairments. Vision is currently normal. Patient able to ambulate and perform ADLs. OT evaluation not indicate at this time.  Jeremie Giangrande L Gwendelyn Lanting 09/22/2020, 12:43 PM

## 2020-09-22 NOTE — Progress Notes (Signed)
Patient ID: Laurie Mullins, female   DOB: 06-05-1985, 35 y.o.   MRN: [id number] Patient admitted early this morning for blurred vision and neck pain and was found to have left vertebral artery dissection.  Teleneurology has recommended MRA of the head and neck and MRI of brain and neurology evaluation.  Patient is waiting to be transferred to [facility].  Patient seen and examined at bedside and plan of care discussed with her.  Continue aspirin and Plavix.  PT/OT/SLP evaluation.  Echo.

## 2020-09-23 ENCOUNTER — Telehealth: Payer: Self-pay

## 2020-09-23 ENCOUNTER — Other Ambulatory Visit: Payer: Self-pay | Admitting: *Deleted

## 2020-09-23 ENCOUNTER — Encounter: Payer: Self-pay | Admitting: Family Medicine

## 2020-09-23 NOTE — Telephone Encounter (Signed)
Pt is scheduled for 7/21

## 2020-09-23 NOTE — Telephone Encounter (Signed)
Pt called saying that she needs an ER Follow up with Dr Jonni Sanger. The hos[ital told her to call us and she also stated that they gave her other orders. Can we use a same day slot next week?

## 2020-09-23 NOTE — Telephone Encounter (Signed)
Please use same day slot next week and schedule appt.

## 2020-09-23 NOTE — Patient Outreach (Signed)
[facility]) Care Management  09/23/2020  Laurie Mullins [birth year] [id number]    Transition of care call/case closure   Referral received:715/22 Initial outreach:09/23/20 Insurance: [place] [employer]    Subjective: Initial successful telephone call to patient's preferred number in order to complete transition of care assessment; 2 HIPAA identifiers verified. Explained purpose of call and completed transition of care assessment.  She states she is not feeling great on today. She reports feeling a little nauseated and having a headache when trying to read, she denies blurry vision. She discussed contacting PCP office regarding work excuse as she does not feel she can return to work even with light duty on tomorrow night taking vicodan prn for headache. She reports getting some relief with prn medication. She tolerating diet, denies bowel or bladder problems.  Spouse is assisting with her  recovery.   Reviewed accessing the following [place] Benefits : She uses  a [facility] outpatient pharmacy at [facility]   Objective:  Laurie Mullins was hospitalized at [facility] 7/13-7/14/22 for Left Vertebral artery dissection . She was discharged to home on 09/22/20 without the need for home health services or DME.   Assessment:  Patient voices good understanding of all discharge instructions.  See transition of care flowsheet for assessment details.   Plan:  Reviewed hospital discharge diagnosis of Left Vertebral artery dissection   and discharge treatment plan using hospital discharge instructions, assessing medication adherence, reviewing problems requiring provider notification, and discussing the importance of follow up with surgeon, primary care provider and/or specialists as directed.  Reviewed [employer] healthy lifestyle program information to receive discounted premium for  2023   Step 1: Get  your annual physical  Step 2: Complete your health  assessment  Step 3:Identify your current health status and complete the corresponding action step between March 12, 2020 and November 10, 2020.    No ongoing care management needs identified so will close case to [place] Management services and route successful outreach letter with [place] Management pamphlet and 24 Hour Nurse Line Magnet to [place] Management clinical pool to be mailed to patient's home address.  Thanked patient for their services to [facility].  Joylene Draft, RN, BSN  [place] Management Coordinator  [phone]- Mobile [phone]- Toll Free Main Office

## 2020-09-28 NOTE — Telephone Encounter (Signed)
Called patient and was able to offer patient 11/02/20 @ 1530 with arrival time of 1500.  Patient accepted appointment.  Patient denied further questions, verbalized understanding and expressed appreciation for the phone call.

## 2020-09-29 ENCOUNTER — Other Ambulatory Visit: Payer: Self-pay

## 2020-09-29 ENCOUNTER — Encounter: Payer: Self-pay | Admitting: Family Medicine

## 2020-09-29 ENCOUNTER — Other Ambulatory Visit (HOSPITAL_COMMUNITY): Payer: Self-pay

## 2020-09-29 ENCOUNTER — Ambulatory Visit: Payer: 59 | Admitting: Family Medicine

## 2020-09-29 VITALS — BP 107/73 | HR 90 | Temp 97.7°F | Ht 68.5 in | Wt 233.6 lb

## 2020-09-29 DIAGNOSIS — I7774 Dissection of vertebral artery: Secondary | ICD-10-CM

## 2020-09-29 DIAGNOSIS — I639 Cerebral infarction, unspecified: Secondary | ICD-10-CM

## 2020-09-29 MED ORDER — HYDROCODONE-ACETAMINOPHEN 5-325 MG PO TABS
1.0000 | ORAL_TABLET | Freq: Four times a day (QID) | ORAL | 0 refills | Status: DC | PRN
  Filled 2020-09-29: qty 20, 5d supply, fill #0

## 2020-09-29 NOTE — Progress Notes (Signed)
Subjective  CC:  Chief Complaint  Patient presents with   Follow-up    Emergency room visit     HPI: Laurie Mullins is a 35 y.o. female who presents to the office today to address the problems listed above in the chief complaint. 35 year old healthy female here for hospital follow-up, transition of care visit.  I reviewed multiple records including emergency room notes, neurology telehealth visits consults, MRI of the brain, MRI/MRA of the brain and neck and lab values.  She was hospitalized 7 /13-7 /14.  To briefly summarize, she had several day history of neck pain and headache.  This was worsened after a fall during a basketball game.  Pain was severe enough that she called out of work for 2 days ago.  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  Then, at work she had blurred vision, visual disturbances with worsening neck pain.  Emergency room evaluation eventually revealed a left vertebral artery dissection.  She was admitted into the hospital, started on aspirin and Plavix.  Labs were drawn and Lipitor was eventually started.  Imaging of the brain and neck revealed a distal left vertebral artery dissection at the base of the neck.  Flow was not obstructed.  Brain MRI was negative for acute stroke.  Her neurologic symptoms improved however she has persistent neck pain. Since discharge, she is mildly improved.  However with limited motion of the neck she has persistent pain.  She is still requiring Vicodin intermittently due to pain.  She was sent home with restrictions from neurology, no lifting greater than 5 pounds, no contact sports, no significant exertion.  She is following these instructions carefully.  Follow-up was recommended in 2 to 4 weeks, however she was unable to obtain an appointment until the end of August.  She has multiple questions regarding her prognosis, restrictions etc.  She does note that her thinking seems a bit off and she is unable to multitask easily currently.  Due to  all of these problems, her Vicodin use, she does not feel that she is able to perform her work-related duties.  She is scheduled to work 12-hour shifts at [employer] this weekend. She denies new neurologic symptoms.  She does note left jaw pain over the last week.  This is new.  She wonders if it is from Lipitor or from the arterial dissection.  No visual disturbances, dysarthria, paresis or significant headaches. Lab work was remarkable for mild hyperlipidemia.  Lipitor was started.  A1c was normal.  MRA did not show any vascular changes consistent with fibromuscular dysplasia.  I believe the dissection was thought to be triggered by mechanical fall during the basketball game.  She has no history of connective tissue disease/disorder.  She denies arthralgias, rashes or unexplained fevers.  Assessment  1. Vertebral artery dissection ([place])   2. Cerebellar stroke, acute ([place])      Plan  Left vertebral artery dissection: Fortunately, patient is doing well.  No CVA, TIA symptoms have resolved.  She is on aspirin, Plavix and Lipitor.  These will be continued until she sees a neurologist.  Due to her significant pain and light duty restrictions I recommend FMLA for the next 2 to 4 weeks.  Patient will get paperwork started.  She will follow-up for any worsening symptoms.  Follow up: 3 months for recheck Visit date not found  No orders of the defined types were placed in this encounter.  Meds ordered this encounter  Medications  HYDROcodone-acetaminophen (NORCO/VICODIN) 5-325 MG tablet    Sig: Take 1 tablet by mouth every 6 (six) hours as needed for severe pain.    Dispense:  20 tablet    Refill:  0       I reviewed the patients updated PMH, FH, and SocHx.    Patient Active Problem List   Diagnosis Date Noted   Vertebral artery dissection ([place]) 09/22/2020   History of melanoma 09/22/2020   Cerebellar stroke, acute ([place]) 09/22/2020   Cystic acne 11/29/2017   Current Meds  Medication  Sig   aspirin 81 MG EC tablet Take 1 tablet (81 mg total) by mouth daily. Swallow whole.   atorvastatin (LIPITOR) 40 MG tablet Take 1 tablet  by mouth daily.   clopidogrel (PLAVIX) 75 MG tablet Take 1 tablet (75 mg total) by mouth daily.   fluticasone (FLONASE) 50 MCG/ACT nasal spray Place 2 sprays into both nostrils daily as needed for allergies.   Multiple Vitamins-Minerals (ADULT ONE DAILY GUMMIES) CHEW Chew 1 tablet by mouth daily.   [DISCONTINUED] HYDROcodone-acetaminophen (NORCO/VICODIN) 5-325 MG tablet Take 1 tablet by mouth every 6 (six) hours as needed for severe pain.    Allergies: Patient has No Known Allergies. Family History: Patient family history includes COPD in her paternal grandmother; Cancer in her paternal grandfather and paternal grandmother; Diabetes in her paternal grandmother; Healthy in her son and son; Heart attack in her maternal grandfather and maternal grandmother; Heart disease in her maternal grandfather and paternal grandmother; Hyperlipidemia in her maternal grandmother; Hypertension in her mother; Kidney Stones in her mother. Social History:  Patient  reports that she has never smoked. She has never used smokeless tobacco. She reports that she does not drink alcohol and does not use drugs.  Review of Systems: Constitutional: Negative for fever malaise or anorexia Cardiovascular: negative for chest pain Respiratory: negative for SOB or persistent cough Gastrointestinal: negative for abdominal pain  Objective  Vitals: BP 107/73   Pulse 90   Temp 97.7 F (36.5 C) (Temporal)   Ht 5' 8.5" (1.74 m)   Wt 233 lb 9.6 oz (106 kg)   LMP 09/22/2020   SpO2 99%   BMI 35.00 kg/m  General: no acute distress , A&Ox3, sits still complains of pain with neck movement HEENT:  conjunctiva normal, neck is supple Cardiovascular:  RRR without murmur or gallop.  Respiratory:  Good breath sounds bilaterally, CTAB with normal respiratory effort     Commons side effects,  risks, benefits, and alternatives for medications and treatment plan prescribed today were discussed, and the patient expressed understanding of the given instructions. Patient is instructed to call or message via MyChart if he/she has any questions or concerns regarding our treatment plan. No barriers to understanding were identified. We discussed Red Flag symptoms and signs in detail. Patient expressed understanding regarding what to do in case of urgent or emergency type symptoms.  Medication list was reconciled, printed and provided to the patient in AVS. Patient instructions and summary information was reviewed with the patient as documented in the AVS. This note was prepared with assistance of Dragon voice recognition software. Occasional wrong-word or sound-a-like substitutions may have occurred due to the inherent limitations of voice recognition software  This visit occurred during the SARS-CoV-2 public health emergency.  Safety protocols were in place, including screening questions prior to the visit, additional usage of staff PPE, and extensive cleaning of exam room while observing appropriate contact time as indicated for disinfecting solutions.

## 2020-09-29 NOTE — Patient Instructions (Signed)
Please return in 3 months for recheck. Sooner if problems.   Please send me FMLA paperwork.  I've written a note excusing you for the next 2-4 weeks.   If you have any questions or concerns, please don't hesitate to send me a message via MyChart or call the office at [phone]. Thank you for visiting with [place] today! It's our pleasure caring for you.

## 2020-09-30 ENCOUNTER — Telehealth: Payer: Self-pay

## 2020-09-30 MED ORDER — CLOPIDOGREL BISULFATE 75 MG PO TABS: 75.0000 mg | ORAL_TABLET | Freq: Every day | ORAL | 2 refills | Status: DC

## 2020-09-30 NOTE — Telephone Encounter (Signed)
[employer] has faxed FMLA twice to our office.   Would be in reference to [place] 7/21.  Patient would like to know if this was received.    If not patient does have a hard copy she can send over.   Please follow back up in regard with patient.

## 2020-09-30 NOTE — Telephone Encounter (Signed)
-----   Message from Garvin Fila, MD sent at 09/30/2020  7:53 AM EDT ----- Thanks for reaching out.  She has vertebral artery dissection and typically we use aspirin and Plavix for 3 months and then reimage and if there is satisfactory recanalization of the vessel switch to aspirin alone.  I can make those decisions when I see her for follow-up.  Kindly refill the Plavix till then ----- Message ----- From: Leamon Arnt, MD Sent: 09/29/2020  11:02 AM EDT To: Garvin Fila, MD  Dr. Leonie Man, Please see this patient's TOC note. She was d/c 7/14 on plavix/asa. Her appt with you is at the end of August. Should she remain on the plavix until she sees you (I can refill if needed). And I have recommended she stay out of work due to pain and restrictions. I'm assuming there is no need to reimage her. Let me know if I need to be doing anything else until she sees you. She has many questions.  Thanks! [employer]

## 2020-10-03 NOTE — Telephone Encounter (Signed)
Patients FMLA has been received and placed in Dr. Tamela Oddi folder.

## 2020-10-03 NOTE — Telephone Encounter (Signed)
Patient would like a call once it has been faxed.

## 2020-10-04 ENCOUNTER — Other Ambulatory Visit (HOSPITAL_COMMUNITY): Payer: Self-pay

## 2020-10-04 MED ORDER — ATORVASTATIN CALCIUM 40 MG PO TABS
40.0000 mg | ORAL_TABLET | Freq: Every day | ORAL | 2 refills | Status: DC
  Filled 2020-10-04 – 2020-10-17 (×2): qty 30, 30d supply, fill #0
  Filled 2020-11-18: qty 30, 30d supply, fill #1
  Filled 2020-12-25: qty 30, 30d supply, fill #2

## 2020-10-04 MED ORDER — CLOPIDOGREL BISULFATE 75 MG PO TABS
75.0000 mg | ORAL_TABLET | Freq: Every day | ORAL | 2 refills | Status: DC
  Filled 2020-10-04 – 2020-10-17 (×2): qty 30, 30d supply, fill #0
  Filled 2020-11-18: qty 30, 30d supply, fill #1

## 2020-10-04 NOTE — Addendum Note (Signed)
Addended by: Marian Sorrow on: 10/04/2020 09:14 AM   Modules accepted: Orders

## 2020-10-05 NOTE — Telephone Encounter (Signed)
FMLA and short term disability forms on your desk

## 2020-10-06 NOTE — Telephone Encounter (Signed)
Papers given to front desk to fax and notify patient

## 2020-10-06 NOTE — Telephone Encounter (Signed)
FMLA and Short Term Disability forms has been faxed. Patient has been notified and will pick up a copy.

## 2020-10-10 ENCOUNTER — Other Ambulatory Visit (HOSPITAL_COMMUNITY): Payer: Self-pay

## 2020-10-17 ENCOUNTER — Other Ambulatory Visit (HOSPITAL_COMMUNITY): Payer: Self-pay

## 2020-11-02 ENCOUNTER — Ambulatory Visit: Payer: 59 | Admitting: Neurology

## 2020-11-02 ENCOUNTER — Encounter: Payer: Self-pay | Admitting: Neurology

## 2020-11-02 VITALS — BP 116/72 | HR 77 | Ht 68.0 in | Wt 235.0 lb

## 2020-11-02 DIAGNOSIS — I7774 Dissection of vertebral artery: Secondary | ICD-10-CM

## 2020-11-02 DIAGNOSIS — M542 Cervicalgia: Secondary | ICD-10-CM

## 2020-11-02 DIAGNOSIS — H538 Other visual disturbances: Secondary | ICD-10-CM | POA: Diagnosis not present

## 2020-11-02 NOTE — Patient Instructions (Signed)
I had a long discussion with the patient regarding her left vertebral artery dissection and episode of blurred vision which is likely posterior circulation TIA from that.  I recommend she stay on dual antiplatelet therapy aspirin and Plavix for total of 3 months and then likely stop Plavix and stay on aspirin alone.  Repeat CT angiogram of the brain and neck in 4 to 6 weeks to look for interval resolution of the dissection.  Patient was also advised to stay on statins and return to light duty but avoid lifting heavy weights.  She was advised to avoid activities which involve rapid neck jerking or risk for injury and fall.  Return for follow-up to see me in 2 months or call earlier if necessary. Vertebral Artery Dissection  Vertebral artery dissection is a tear in a vertebral artery. The vertebral arteries are major blood vessels at the base of the neck. They carry blood from the heart to the brain. When an artery tears, blood collects inside the layersof the artery wall. This can cause a blood clot. This condition increases the risk for stroke if it is not diagnosed and treatedright away. It is a common cause of stroke in people who are 30-32 years old. What are the causes? This condition may be caused by: A neck injury due to sudden or too much neck movement. Having weak blood vessel walls. The walls may tear even when no injury occurs (spontaneous dissection). What increases the risk? The following factors may make you more likely to develop this condition: High blood pressure (hypertension). Migraines. Inherited diseases that affect the strength or shape of the blood vessels. What are the signs or symptoms? Symptoms usually appear within days of an injury, but sometimes they may notappear for weeks or years. Common symptoms of this condition include: Stabbing, sharp pain in the head, neck, eye, or face. Dizziness. Vertigo. This is a feeling that you or things around you are moving when they are  not. Double vision. Other symptoms include: Hoarse voice. Hearing loss. Hiccups. Loss of taste. Difficulty speaking. Loss of balance. Difficulty swallowing. Ear pain. Nausea and vomiting. Loss of feeling in the torso, legs, or arms. How is this diagnosed? This condition may be diagnosed based on tests, such as: CT angiogram. X-ray images of your vertebral arteries are taken. A dye makes the images clear. MRI angiogram. This is used to check the health of blood vessels. Cerebral angiogram. X-ray images of blood vessels in the brain and neck are taken. A dye makes the images clear. Doppler ultrasound. This test creates images using sound waves. It shows how well blood flows through your arteries. How is this treated? Treatment depends on the cause of your condition and on your overall health. The goal of treatment is to prevent a stroke. If you are having a stroke, it is important to get treatment quickly. Treatment may include: Blood thinners. This medicine helps to prevent blood clots. This may be given first through an IV, and then as pills for 3-6 months. Procedures to widen a narrow blood vessel (angioplasty) or to place a mesh tube (stent) inside the blood vessel to keep it open. Surgery to repair the area. This is rarely needed. Follow these instructions at home: Medicines Take over-the-counter and prescription medicines only as told by your health care provider. If you are taking blood thinners: Talk with your health care provider before you take any medicines that contain aspirin or NSAIDs. These medicines increase your risk for dangerous bleeding.  Take your medicine exactly as told, at the same time every day. Avoid activities that could cause injury or bruising. Follow instructions about how to prevent falls. Wear a medical alert bracelet or carry a card that lists what medicines you take. Lifestyle  Work with your health care provider to control hypertension. This may  include: Exercising regularly. Check with your health care provider before starting a new type of exercise. Eating a heart-healthy diet of fruits, vegetables, whole grains, and lean meats. Limit unhealthy fats and eat more healthy fats such as avocados, eggs, and oily fish. Reducing the amount of salt (sodium) that you eat to less than 1,500 mg a day. Reducing stress by doing things that you enjoy and avoiding things that cause you stress. Do not use any products that contain nicotine or tobacco, such as cigarettes, e-cigarettes, and chewing tobacco. If you need help quitting, ask your health care provider.  General instructions If you drink alcohol: Limit how much you use to: 0-1 drink a day for women. 0-2 drinks a day for men. Be aware of how much alcohol is in your drink. In the [place] one drink equals one 12 oz bottle of beer (355 mL), one 5 oz glass of wine (148 mL), or one 1 oz of hard liquor (44 mL). Keep all follow-up visits as told by your health care provider. This is important. Contact a health care provider if you: Feel weak or dizzy. Have a fever. Get help right away if: You have any symptoms of a stroke. "BE FAST" is an easy way to remember the main warning signs of a stroke: B - Balance. Signs are dizziness, sudden trouble walking, or loss of balance. E - Eyes. Signs are trouble seeing or a sudden change in vision. F - Face. Signs are sudden weakness or numbness of the face, or the face or eyelid drooping on one side. A - Arms. Signs are weakness or numbness in an arm. This happens suddenly and usually on one side of the body. S - Speech. Signs are sudden trouble speaking, slurred speech, or trouble understanding what people say. T - Time. Time to call emergency services. Write down what time symptoms started. You have other signs of a stroke, such as: A sudden, severe headache with no known cause. Nausea or vomiting. Seizure. You have other symptoms, such as: Difficulty  breathing. Chest pain. These symptoms may represent a serious problem that is an emergency. Do not wait to see if the symptoms will go away. Get medical help right away. Call your local emergency services (911 in the [place] Do not drive yourself to the hospital. Summary Vertebral artery dissection is a tear in an artery that carries blood from the heart to the brain. Symptoms usually appear within days of an injury, but sometimes they may not appear for weeks or years. This condition increases the risk for stroke if it is not diagnosed and treated right away. Treatment depends on the cause of your condition and on your overall health. The goal of treatment is to prevent a stroke. Get help right away if you have any symptoms of a stroke. This information is not intended to replace advice given to you by your health care provider. Make sure you discuss any questions you have with your healthcare provider. Document Revised: 11/21/2017 Document Reviewed: 11/21/2017 Elsevier Patient Education  2022 [employer].

## 2020-11-02 NOTE — Progress Notes (Signed)
[facility] [address] [place]. [place] [address] [phone]       OFFICE CONSULT NOTE  [name] Date of Birth:  21-Sep-1985 Medical Record Number:  [id number]   Referring MD: Lesleigh Noe  Reason for Referral: Vertebral artery dissection  HPI: Laurie Mullins is a pleasant 35 year old Caucasian lady seen today for initial office consultation visit for TIA and vertebral artery dissection.  History is obtained from the patient and review of electronic medical records and I personally reviewed pertinent imaging films in PACS.  Patient has no significant past medical history history except cystic acne and history of melanoma.  She presented on 09/22/2020 to [facility], ER for evaluation for neck pain, bilateral blurred vision and headache.  Few days prior she had a haircut with neck extended in the salon be seen for it to be washed only.  She  states she was playing basketball and fell down and hit the back of her neck and shoulders.  Since then she started noticing posterior neck pain on the left side as well as some headache.  She took Advil which worked only for short-term.  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  She stated she has some kaleidoscopic vision also for short period which resolved.  She is trying to read text and it is difficult for her to make out the words.  The vision appeared to be blurred in both eyes.  She denied any double vision, vertigo, gait balance difficulties, weakness numbness or tingling.  She had CT angiogram of the neck and brain which showed small focal filling defect in the left V3/V4 vertebral artery but without flow limitation raising concern for small focal dissection with a nonocclusive clot.  MRI scan of the brain was negative for any acute stroke and MR angiogram of the neck actually showed excellent flow in the vertebral artery without evidence of clot or  dissection noted.  The MR angiogram source images of the brain with showed possible dissection.  Patient was started on dual antiplatelet therapy aspirin and Plavix.  LDL cholesterol revealed elevated at 128 mg percent and she was started on a statin.  Hemoglobin A1c was 5.4.  EKG was unremarkable and echocardiogram was not done.  Patient states she is done well since discharge and not had any focal neurological symptoms or recurrent stroke symptoms.  She however continues to have some mild posterior neck pain with headache is resolved.  She is tolerating aspirin and Plavix well without bruising or bleeding.  She is tolerating Lipitor without muscle aches and pains.  She has questions about returning to work in about lifting weights.  She denies any prior history of stroke, TIA, seizures or loss of consciousness or significant head injury.  She has used to have menstrual migraines which appear to have improved now  ROS:   14 system review of systems is positive for neck pain, dizziness, jaw pain, blurred vision and all other systems negative  PMH:  Past Medical History:  Diagnosis Date   Atypical mole 01/01/2018   moderate atypia - right calf   Atypical mole 01/01/2018   moderate atypia - right buttock   Atypical mole 01/01/2018   moderate atypia - right upper back   Atypical mole 04/17/2018   mild atypia - left mid paraspinal   Atypical mole 04/17/2018   mild atypia - right mid paraspinal   Atypical  mole 04/17/2018   mild atypia - right mid back   Atypical mole 04/17/2018   moderate atypia - left upper back   Atypical mole 11/03/2018   mild atypia - mid spine   Atypical mole 11/03/2018   mild atypia - right outer thigh   Atypical mole 11/03/2018   moderate atypia - left outer forearm   Atypical mole 11/03/2018   moderate atypia - under left breast   Atypical mole 05/12/2019   moderate to severe - right chest Mindi Slicker)   Cystic acne 11/29/2017   History of chicken pox    History  of melanoma 09/22/2020   Melanoma ([place]) 01/01/2018   melanoma in situ on left side back - excision with margins free   Melanoma in situ ([place]) 05/12/2019   residual MIS on left side back - excision done 06/11/2019    Social History:  Social History   Socioeconomic History   Marital status: Married    Spouse name: Not on file   Number of children: Not on file   Years of education: Not on file   Highest education level: Not on file  Occupational History    Employer: [employer]  Tobacco Use   Smoking status: Never   Smokeless tobacco: Never  Vaping Use   Vaping Use: Never used  Substance and Sexual Activity   Alcohol use: No   Drug use: No   Sexual activity: Yes  Other Topics Concern   Not on file  Social History Narrative   Not on file   Social Determinants of Health   Financial Resource Strain: Not on file  Food Insecurity: Not on file  Transportation Needs: Not on file  Physical Activity: Not on file  Stress: Not on file  Social Connections: Not on file  Intimate Partner Violence: Not on file    Medications:   Current Outpatient Medications on File Prior to Visit  Medication Sig Dispense Refill   aspirin 81 MG EC tablet Take 1 tablet (81 mg total) by mouth daily. Swallow whole. 30 tablet 0   atorvastatin (LIPITOR) 40 MG tablet Take 1 tablet  by mouth daily. 30 tablet 2   clopidogrel (PLAVIX) 75 MG tablet Take 1 tablet (75 mg total) by mouth daily. 30 tablet 2   fluticasone (FLONASE) 50 MCG/ACT nasal spray Place 2 sprays into both nostrils daily as needed for allergies.     Multiple Vitamins-Minerals (ADULT ONE DAILY GUMMIES) CHEW Chew 1 tablet by mouth daily.     [DISCONTINUED] levonorgestrel (MIRENA, 52 MG,) 20 MCG/24HR IUD Mirena 20 mcg/24 hours (6 yrs) 52 mg intrauterine device  Take 1 device by intrauterine route.     No current facility-administered medications on file prior to visit.    Allergies:  No Known Allergies  Physical Exam General: Mildly  obese young Caucasian lady, seated, in no evident distress Head: head normocephalic and atraumatic.   Neck: supple with no carotid or supraclavicular bruits Cardiovascular: regular rate and rhythm, no murmurs Musculoskeletal: no deformity Skin:  no rash/petichiae Vascular:  Normal pulses all extremities  Neurologic Exam Mental Status: Awake and fully alert. Oriented to place and time. Recent and remote memory intact. Attention span, concentration and fund of knowledge appropriate. Mood and affect appropriate.  Cranial Nerves: Fundoscopic exam reveals sharp disc margins. Pupils equal, briskly reactive to light. Extraocular movements full without nystagmus. Visual fields full to confrontation. Hearing intact. Facial sensation intact. Face, tongue, palate moves normally and symmetrically.  Motor: Normal bulk and tone. Normal  strength in all tested extremity muscles. Sensory.: intact to touch , pinprick , position and vibratory sensation.  Coordination: Rapid alternating movements normal in all extremities. Finger-to-nose and heel-to-shin performed accurately bilaterally. Gait and Station: Arises from chair without difficulty. Stance is normal. Gait demonstrates normal stride length and balance . Able to heel, toe and tandem walk without difficulty.  Reflexes: 1+ and symmetric. Toes downgoing.   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  She  is doing well from neurovascular standpoint.  No significant vascular risk factors except mild hyperlipidemia and obesity     PLAN: I had a long discussion with the patient regarding her left vertebral artery dissection and episode of blurred vision which is likely posterior circulation TIA from that.  I recommend she stay on dual antiplatelet therapy aspirin and Plavix for total  of 3 months and then likely stop Plavix and stay on aspirin alone.  Repeat CT angiogram of the brain and neck in 4 to 6 weeks to look for interval resolution of the dissection.  Patient was also advised to stay on statins and return to light duty but avoid lifting heavy weights.  She was advised to avoid activities which involve rapid neck jerking or risk for injury and fall.  Return for follow-up to see me in 2 months or call earlier if necessary.  Greater than 50% time during this 45-minute consultation visit was spent on counseling and coordination of care about her traumatic vertebral artery dissection and risk for stroke and answering questions.  Antony Contras, MD Note: This document was prepared with digital dictation and possible smart phrase technology. Any transcriptional errors that result from this process are unintentional.

## 2020-11-03 ENCOUNTER — Telehealth: Payer: Self-pay | Admitting: Neurology

## 2020-11-03 NOTE — Telephone Encounter (Signed)
cone umr order sent to GI. [facility] they will reach out to the patient to schedule.

## 2020-11-19 ENCOUNTER — Other Ambulatory Visit (HOSPITAL_COMMUNITY): Payer: Self-pay

## 2020-11-22 ENCOUNTER — Telehealth: Payer: Self-pay

## 2020-11-22 NOTE — Telephone Encounter (Signed)
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

## 2020-11-22 NOTE — Telephone Encounter (Signed)
Noted  

## 2020-12-26 ENCOUNTER — Inpatient Hospital Stay: Payer: Self-pay | Admitting: Neurology

## 2020-12-26 ENCOUNTER — Other Ambulatory Visit (HOSPITAL_COMMUNITY): Payer: Self-pay

## 2020-12-27 ENCOUNTER — Ambulatory Visit
Admission: RE | Admit: 2020-12-27 | Discharge: 2020-12-27 | Disposition: A | Discharge status: Home / Self Care | Payer: 59 | Source: Ambulatory Visit | Attending: Neurology | Admitting: Neurology

## 2020-12-27 ENCOUNTER — Other Ambulatory Visit: Payer: 59

## 2020-12-27 DIAGNOSIS — I7774 Dissection of vertebral artery: Secondary | ICD-10-CM

## 2020-12-27 MED ORDER — IOPAMIDOL (ISOVUE-370) INJECTION 76%
75.0000 mL | Freq: Once | INTRAVENOUS | Status: AC | PRN
  Administered 2020-12-27: 75 mL via INTRAVENOUS

## 2020-12-29 ENCOUNTER — Encounter: Payer: Self-pay | Admitting: *Deleted

## 2021-01-04 ENCOUNTER — Ambulatory Visit: Payer: 59 | Admitting: Dermatology

## 2021-01-24 ENCOUNTER — Ambulatory Visit: Payer: 59 | Admitting: Neurology

## 2021-01-24 VITALS — BP 109/75 | HR 73 | Ht 68.0 in | Wt 235.0 lb

## 2021-01-24 DIAGNOSIS — I7774 Dissection of vertebral artery: Secondary | ICD-10-CM

## 2021-01-24 DIAGNOSIS — E7849 Other hyperlipidemia: Secondary | ICD-10-CM

## 2021-01-24 NOTE — Progress Notes (Signed)
[facility] [address] [place]. [place] [address] [phone]       OFFICE FOLLOW UP VISIT NOTE  [name] Date of Birth:  Nov 17, 1985 Medical Record Number:  [id number]   Referring MD: Lesleigh Noe  Reason for Referral: Vertebral artery dissection  HPI: Initial visit 11/02/2020 [name] is a pleasant 35 year old Caucasian lady seen today for initial office consultation visit for TIA and vertebral artery dissection.  History is obtained from the patient and review of electronic medical records and I personally reviewed pertinent imaging films in PACS.  Patient has no significant past medical history history except cystic acne and history of melanoma.  She presented on 09/22/2020 to [facility], ER for evaluation for neck pain, bilateral blurred vision and headache.  Few days prior she had a haircut with neck extended in the salon be seen for it to be washed only.  She  states she was playing basketball and fell down and hit the back of her neck and shoulders.  Since then she started noticing posterior neck pain on the left side as well as some headache.  She took Advil which worked only for short-term.  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  She stated she has some kaleidoscopic vision also for short period which resolved.  She is trying to read text and it is difficult for her to make out the words.  The vision appeared to be blurred in both eyes.  She denied any double vision, vertigo, gait balance difficulties, weakness numbness or tingling.  She had CT angiogram of the neck and brain which showed small focal filling defect in the left V3/V4 vertebral artery but without flow limitation raising concern for small focal dissection with a nonocclusive clot.  MRI scan of the brain was negative for any acute stroke and MR angiogram of the neck actually showed excellent flow in the vertebral artery  without evidence of clot or dissection noted.  The MR angiogram source images of the brain with showed possible dissection.  Patient was started on dual antiplatelet therapy aspirin and Plavix.  LDL cholesterol revealed elevated at 128 mg percent and she was started on a statin.  Hemoglobin A1c was 5.4.  EKG was unremarkable and echocardiogram was not done.  Patient states she is done well since discharge and not had any focal neurological symptoms or recurrent stroke symptoms.  She however continues to have some mild posterior neck pain with headache is resolved.  She is tolerating aspirin and Plavix well without bruising or bleeding.  She is tolerating Lipitor without muscle aches and pains.  She has questions about returning to work in about lifting weights.  She denies any prior history of stroke, TIA, seizures or loss of consciousness or significant head injury.  She has used to have menstrual migraines which appear to have improved now Update 01/24/2021: She returns for follow-up after last visit 2 and half months ago.  She is accompanied by her husband.  Patient states she is doing well.  She is under recurrent stroke or TIA symptoms.  She is made full recovery from her stroke and has no residual deficits.  She is back to returning to work full-time and has no problems doing her job.  She remains on aspirin which is tolerating well with only minor bruising but no bleeding episodes.  She has stopped Plavix.  She remains on Lipitor  which is tolerating well without muscle aches and pains.  Blood pressure is well controlled today it is 109/75.  She has no new complaints.  She had follow-up CT angiogram of the brain and neck done on 12/27/2020 which showed complete resolution of small left V3/V4 dissection no significant stenosis. ROS:   14 system review of systems is positive for no complaints today and all other systems negative  PMH:  Past Medical History:  Diagnosis Date   Atypical mole 01/01/2018    moderate atypia - right calf   Atypical mole 01/01/2018   moderate atypia - right buttock   Atypical mole 01/01/2018   moderate atypia - right upper back   Atypical mole 04/17/2018   mild atypia - left mid paraspinal   Atypical mole 04/17/2018   mild atypia - right mid paraspinal   Atypical mole 04/17/2018   mild atypia - right mid back   Atypical mole 04/17/2018   moderate atypia - left upper back   Atypical mole 11/03/2018   mild atypia - mid spine   Atypical mole 11/03/2018   mild atypia - right outer thigh   Atypical mole 11/03/2018   moderate atypia - left outer forearm   Atypical mole 11/03/2018   moderate atypia - under left breast   Atypical mole 05/12/2019   moderate to severe - right chest ([place])   Cystic acne 11/29/2017   History of chicken pox    History of melanoma 09/22/2020   Melanoma ([place]) 01/01/2018   melanoma in situ on left side back - excision with margins free   Melanoma in situ ([place]) 05/12/2019   residual MIS on left side back - excision done 06/11/2019    Social History:  Social History   Socioeconomic History   Marital status: Married    Spouse name: Not on file   Number of children: Not on file   Years of education: Not on file   Highest education level: Not on file  Occupational History    Employer: [employer]  Tobacco Use   Smoking status: Never   Smokeless tobacco: Never  Vaping Use   Vaping Use: Never used  Substance and Sexual Activity   Alcohol use: No   Drug use: No   Sexual activity: Yes  Other Topics Concern   Not on file  Social History Narrative   Not on file   Social Determinants of Health   Financial Resource Strain: Not on file  Food Insecurity: Not on file  Transportation Needs: Not on file  Physical Activity: Not on file  Stress: Not on file  Social Connections: Not on file  Intimate Partner Violence: Not on file    Medications:   Current Outpatient Medications on File Prior to Visit  Medication Sig  Dispense Refill   aspirin 81 MG EC tablet Take 1 tablet (81 mg total) by mouth daily. Swallow whole. 30 tablet 0   atorvastatin (LIPITOR) 40 MG tablet Take 1 tablet  by mouth daily. 30 tablet 2   fluticasone (FLONASE) 50 MCG/ACT nasal spray Place 2 sprays into both nostrils daily as needed for allergies.     Multiple Vitamins-Minerals (ADULT ONE DAILY GUMMIES) CHEW Chew 1 tablet by mouth daily.     [DISCONTINUED] levonorgestrel (MIRENA, 52 MG,) 20 MCG/24HR IUD Mirena 20 mcg/24 hours (6 yrs) 52 mg intrauterine device  Take 1 device by intrauterine route.     No current facility-administered medications on file prior to visit.    Allergies:  No  Known Allergies  Physical Exam General: Mildly obese young Caucasian lady, seated, in no evident distress Head: head normocephalic and atraumatic.   Neck: supple with no carotid or supraclavicular bruits Cardiovascular: regular rate and rhythm, no murmurs Musculoskeletal: no deformity Skin:  no rash/petichiae Vascular:  Normal pulses all extremities  Neurologic Exam Mental Status: Awake and fully alert. Oriented to place and time. Recent and remote memory intact. Attention span, concentration and fund of knowledge appropriate. Mood and affect appropriate.  Cranial Nerves: Fundoscopic exam not done. Pupils equal, briskly reactive to light. Extraocular movements full without nystagmus. Visual fields full to confrontation. Hearing intact. Facial sensation intact. Face, tongue, palate moves normally and symmetrically.  Motor: Normal bulk and tone. Normal strength in all tested extremity muscles. Sensory.: intact to touch , pinprick , position and vibratory sensation.  Coordination: Rapid alternating movements normal in all extremities. Finger-to-nose and heel-to-shin performed accurately bilaterally. Gait and Station: Arises from chair without difficulty. Stance is normal. Gait demonstrates normal stride length and balance . Able to heel, toe and tandem  walk without difficulty.  Reflexes: 1+ and symmetric. Toes downgoing.   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  She  is doing well from neurovascular standpoint.  No significant vascular risk factors except mild hyperlipidemia and obesity     PLAN: I had a long d/w patient and her husband about her recent TIA, vertebral artery dissection,risk for recurrent stroke/TIAs, personally independently reviewed imaging studies and stroke evaluation results and answered questions.Continue aspirin 81 mg daily  for secondary stroke prevention and maintain strict control of hypertension with blood pressure goal below 130/90, diabetes with hemoglobin A1c goal below 6.5% and lipids with LDL cholesterol goal below 70 mg/dL. I also advised the patient to eat a healthy diet with plenty of whole grains, cereals, fruits and vegetables, exercise regularly and maintain ideal body weight .check follow-up lipid profile followup in the future with me only as necessary and no schedule follow-up appointment was made...  Greater than 50% time during this 25-minute visit was spent on counseling and coordination of care about her traumatic vertebral artery dissection and risk for stroke and answering questions.  Antony Contras, MD Note: This document was prepared with digital dictation and possible smart phrase technology. Any transcriptional errors that result from this process are unintentional.

## 2021-01-24 NOTE — Patient Instructions (Signed)
I had a long d/w patient and her husband about her recent TIA, vertebral artery dissection,risk for recurrent stroke/TIAs, personally independently reviewed imaging studies and stroke evaluation results and answered questions.Continue aspirin 81 mg daily  for secondary stroke prevention and maintain strict control of hypertension with blood pressure goal below 130/90, diabetes with hemoglobin A1c goal below 6.5% and lipids with LDL cholesterol goal below 70 mg/dL. I also advised the patient to eat a healthy diet with plenty of whole grains, cereals, fruits and vegetables, exercise regularly and maintain ideal body weight .check follow-up lipid profile followup in the future with me only as necessary and no schedule follow-up appointment was made.. Stroke Prevention Some medical conditions and behaviors can lead to a higher chance of having a stroke. You can help prevent a stroke by eating healthy, exercising, not smoking, and managing any medical conditions you have. Stroke is a leading cause of functional impairment. Primary prevention is particularly important because a majority of strokes are first-time events. Stroke changes the lives of not only those who experience a stroke but also their family and other caregivers. How can this condition affect me? A stroke is a medical emergency and should be treated right away. A stroke can lead to brain damage and can sometimes be life-threatening. If a person gets medical treatment right away, there is a better chance of surviving and recovering from a stroke. What can increase my risk? The following medical conditions may increase your risk of a stroke: Cardiovascular disease. High blood pressure (hypertension). Diabetes. High cholesterol. Sickle cell disease. Blood clotting disorders (hypercoagulable state). Obesity. Sleep disorders (obstructive sleep apnea). Other risk factors include: Being older than age 30. Having a history of blood clots, stroke,  or mini-stroke (transient ischemic attack, TIA). Genetic factors, such as race, ethnicity, or a family history of stroke. Smoking cigarettes or using other tobacco products. Taking birth control pills, especially if you also use tobacco. Heavy use of alcohol or drugs, especially cocaine and methamphetamine. Physical inactivity. What actions can I take to prevent this? Manage your health conditions High cholesterol levels. Eating a healthy diet is important for preventing high cholesterol. If cholesterol cannot be managed through diet alone, you may need to take medicines. Take any prescribed medicines to control your cholesterol as told by your health care provider. Hypertension. To reduce your risk of stroke, try to keep your blood pressure below 130/80. Eating a healthy diet and exercising regularly are important for controlling blood pressure. If these steps are not enough to manage your blood pressure, you may need to take medicines. Take any prescribed medicines to control hypertension as told by your health care provider. Ask your health care provider if you should monitor your blood pressure at home. Have your blood pressure checked every year, even if your blood pressure is normal. Blood pressure increases with age and some medical conditions. Diabetes. Eating a healthy diet and exercising regularly are important parts of managing your blood sugar (glucose). If your blood sugar cannot be managed through diet and exercise, you may need to take medicines. Take any prescribed medicines to control your diabetes as told by your health care provider. Get evaluated for obstructive sleep apnea. Talk to your health care provider about getting a sleep evaluation if you snore a lot or have excessive sleepiness. Make sure that any other medical conditions you have, such as atrial fibrillation or atherosclerosis, are managed. Nutrition Follow instructions from your health care provider about what  to eat or drink  to help manage your health condition. These instructions may include: Reducing your daily calorie intake. Limiting how much salt (sodium) you use to 1,500 milligrams (mg) each day. Using only healthy fats for cooking, such as olive oil, canola oil, or sunflower oil. Eating healthy foods. You can do this by: Choosing foods that are high in fiber, such as whole grains, and fresh fruits and vegetables. Eating at least 5 servings of fruits and vegetables a day. Try to fill one-half of your plate with fruits and vegetables at each meal. Choosing lean protein foods, such as lean cuts of meat, poultry without skin, fish, tofu, beans, and nuts. Eating low-fat dairy products. Avoiding foods that are high in sodium. This can help lower blood pressure. Avoiding foods that have saturated fat, trans fat, and cholesterol. This can help prevent high cholesterol. Avoiding processed and prepared foods. Counting your daily carbohydrate intake.  Lifestyle If you drink alcohol: Limit how much you have to: 0-1 drink a day for women who are not pregnant. 0-2 drinks a day for men. Know how much alcohol is in your drink. In the [place] one drink equals one 12 oz bottle of beer (361mL), one 5 oz glass of wine (122mL), or one 1 oz glass of hard liquor (59mL). Do not use any products that contain nicotine or tobacco. These products include cigarettes, chewing tobacco, and vaping devices, such as e-cigarettes. If you need help quitting, ask your health care provider. Avoid secondhand smoke. Do not use drugs. Activity  Try to stay at a healthy weight. Get at least 30 minutes of exercise on most days, such as: Fast walking. Biking. Swimming. Medicines Take over-the-counter and prescription medicines only as told by your health care provider. Aspirin or blood thinners (antiplatelets or anticoagulants) may be recommended to reduce your risk of forming blood clots that can lead to stroke. Avoid taking  birth control pills. Talk to your health care provider about the risks of taking birth control pills if: You are over 49 years old. You smoke. You get very bad headaches. You have had a blood clot. Where to find more information American Stroke Association: www.strokeassociation.org Get help right away if: You or a loved one has any symptoms of a stroke. "BE FAST" is an easy way to remember the main warning signs of a stroke: B - Balance. Signs are dizziness, sudden trouble walking, or loss of balance. E - Eyes. Signs are trouble seeing or a sudden change in vision. F - Face. Signs are sudden weakness or numbness of the face, or the face or eyelid drooping on one side. A - Arms. Signs are weakness or numbness in an arm. This happens suddenly and usually on one side of the body. S - Speech. Signs are sudden trouble speaking, slurred speech, or trouble understanding what people say. T - Time. Time to call emergency services. Write down what time symptoms started. You or a loved one has other signs of a stroke, such as: A sudden, severe headache with no known cause. Nausea or vomiting. Seizure. These symptoms may represent a serious problem that is an emergency. Do not wait to see if the symptoms will go away. Get medical help right away. Call your local emergency services (911 in the [place] Do not drive yourself to the hospital. Summary You can help to prevent a stroke by eating healthy, exercising, not smoking, limiting alcohol intake, and managing any medical conditions you may have. Do not use any products that contain nicotine or  tobacco. These include cigarettes, chewing tobacco, and vaping devices, such as e-cigarettes. If you need help quitting, ask your health care provider. Remember "BE FAST" for warning signs of a stroke. Get help right away if you or a loved one has any of these signs. This information is not intended to replace advice given to you by your health care provider. Make  sure you discuss any questions you have with your health care provider. Document Revised: 09/28/2019 Document Reviewed: 09/28/2019 Elsevier Patient Education  [place].

## 2021-01-25 ENCOUNTER — Encounter: Payer: Self-pay | Admitting: *Deleted

## 2021-01-25 LAB — LIPID PANEL
Chol/HDL Ratio: 2.8 ratio (ref 0.0–4.4)
Cholesterol, Total: 141 mg/dL (ref 100–199)
HDL: 50 mg/dL (ref >39)
LDL Chol Calc (NIH): 78 mg/dL (ref 0–99)
Triglycerides: 65 mg/dL (ref 0–149)
VLDL Cholesterol Cal: 13 mg/dL (ref 5–40)

## 2021-02-04 ENCOUNTER — Other Ambulatory Visit: Payer: Self-pay | Admitting: Family Medicine

## 2021-02-06 ENCOUNTER — Other Ambulatory Visit (HOSPITAL_COMMUNITY): Payer: Self-pay

## 2021-02-06 MED ORDER — ATORVASTATIN CALCIUM 40 MG PO TABS
40.0000 mg | ORAL_TABLET | Freq: Every day | ORAL | 2 refills | Status: DC
  Filled 2021-02-06: qty 30, 30d supply, fill #0
  Filled 2021-03-13: qty 30, 30d supply, fill #1
  Filled 2021-04-26: qty 30, 30d supply, fill #2

## 2021-03-14 ENCOUNTER — Other Ambulatory Visit (HOSPITAL_COMMUNITY): Payer: Self-pay

## 2021-04-11 ENCOUNTER — Ambulatory Visit: Payer: 59 | Admitting: Dermatology

## 2021-04-26 ENCOUNTER — Other Ambulatory Visit (HOSPITAL_COMMUNITY): Payer: Self-pay

## 2021-05-06 ENCOUNTER — Emergency Department
Admission: RE | Admit: 2021-05-06 | Discharge: 2021-05-06 | Disposition: A | Discharge status: Home / Self Care | Payer: 59 | Source: Ambulatory Visit

## 2021-05-06 ENCOUNTER — Other Ambulatory Visit: Payer: Self-pay

## 2021-05-06 VITALS — BP 122/85 | HR 78 | Temp 98.2°F | Resp 20 | Ht 68.0 in | Wt 235.0 lb

## 2021-05-06 DIAGNOSIS — R42 Dizziness and giddiness: Secondary | ICD-10-CM

## 2021-05-06 DIAGNOSIS — H6122 Impacted cerumen, left ear: Secondary | ICD-10-CM | POA: Diagnosis not present

## 2021-05-06 DIAGNOSIS — H6692 Otitis media, unspecified, left ear: Secondary | ICD-10-CM | POA: Diagnosis not present

## 2021-05-06 HISTORY — DX: Dissection of vertebral artery: I77.74

## 2021-05-06 MED ORDER — FLUCONAZOLE 150 MG PO TABS: ORAL_TABLET | ORAL | 0 refills | Status: DC

## 2021-05-06 MED ORDER — AMOXICILLIN-POT CLAVULANATE 875-125 MG PO TABS: 1.0000 | ORAL_TABLET | Freq: Two times a day (BID) | ORAL | 0 refills | Status: DC

## 2021-05-06 NOTE — ED Triage Notes (Addendum)
Pt presents to Urgent Care with c/o L ear pain and dizziness since last night, mild nausea intermittently. Reports worsening when turning head. States she had a sore throat earlier in the week. Also reporting L occipital-region headache and states she had a L vertebral artery dissection in July 2022.

## 2021-05-06 NOTE — ED Provider Notes (Signed)
[facility]    CSN: [id number] Arrival date & time: 05/06/21  1258      History   Chief Complaint Chief Complaint  Patient presents with   Dizziness   Otalgia    HPI Laurie Mullins is a 36 y.o. female.   HPI 36 year old female presents with left ear pain and dizziness since last night with mild nausea intermittently.  Patient reports dizziness is worse when turning her head.  Reports sore throat earlier in the week..  Additionally, patient reports left occipital/parietal regional headache which began earlier today.  Patient reports headache pain as 4 of 10 and described headache pain as pulsatile.  PMH significant for left vertebral artery dissection in July 2022 and acute cerebellar stroke.  Patient is currently anticoagulated with daily baby aspirin 81 mg.  Patient reports yesterday when she laid down to sleep she experienced dizziness and left ear pressure. Past Medical History:  Diagnosis Date   Atypical mole 01/01/2018   moderate atypia - right calf   Atypical mole 01/01/2018   moderate atypia - right buttock   Atypical mole 01/01/2018   moderate atypia - right upper back   Atypical mole 04/17/2018   mild atypia - left mid paraspinal   Atypical mole 04/17/2018   mild atypia - right mid paraspinal   Atypical mole 04/17/2018   mild atypia - right mid back   Atypical mole 04/17/2018   moderate atypia - left upper back   Atypical mole 11/03/2018   mild atypia - mid spine   Atypical mole 11/03/2018   mild atypia - right outer thigh   Atypical mole 11/03/2018   moderate atypia - left outer forearm   Atypical mole 11/03/2018   moderate atypia - under left breast   Atypical mole 05/12/2019   moderate to severe - right chest (widershave)   Cystic acne 11/29/2017   Dissection, vertebral artery [facility])    July 2022   History of chicken pox    History of melanoma 09/22/2020   Melanoma ([place]) 01/01/2018   melanoma in situ on left side back - excision with  margins free   Melanoma in situ ([place]) 05/12/2019   residual MIS on left side back - excision done 06/11/2019    Patient Active Problem List   Diagnosis Date Noted   Vertebral artery dissection ([place]) 09/22/2020   History of melanoma 09/22/2020   Cerebellar stroke, acute ([place]) 09/22/2020   Cystic acne 11/29/2017    Past Surgical History:  Procedure Laterality Date   DILATION AND EVACUATION Bilateral 03/26/2013   Procedure: DILATATION AND EVACUATION;  Surgeon: Daria Pastures, MD;  Location: [facility];  Service: Gynecology;  Laterality: Bilateral;   WISDOM TOOTH EXTRACTION     WISDOM TOOTH EXTRACTION      OB History     Gravida  2   Para  2   Term  2   Preterm  0   AB  0   Living  2      SAB  0   IAB  0   Ectopic  0   Multiple  0   Live Births  2            Home Medications    Prior to Admission medications   Medication Sig Start Date End Date Taking? Authorizing Provider  amoxicillin-clavulanate (AUGMENTIN) 875-125 MG tablet Take 1 tablet by mouth every 12 (twelve) hours. 05/06/21  Yes Eliezer Lofts, FNP  fluconazole (DIFLUCAN) 150 MG tablet Take 1  tab p.o. for vaginal candidiasis, may repeat 1 tab p.o. in 3 days if symptoms are not resolved. 05/06/21  Yes Eliezer Lofts, FNP  aspirin 81 MG EC tablet Take 1 tablet (81 mg total) by mouth daily. Swallow whole. 09/23/20   Aline August, MD  atorvastatin (LIPITOR) 40 MG tablet Take 1 tablet  by mouth daily. 02/06/21   Leamon Arnt, MD  fluticasone [facility]) 50 MCG/ACT nasal spray Place 2 sprays into both nostrils daily as needed for allergies. 09/22/20   Aline August, MD  Multiple Vitamins-Minerals (ADULT ONE DAILY GUMMIES) CHEW Chew 1 tablet by mouth daily.    [provider]  levonorgestrel (MIRENA, 52 MG,) 20 MCG/24HR IUD Mirena 20 mcg/24 hours (6 yrs) 52 mg intrauterine device  Take 1 device by intrauterine route.  09/22/20  [provider]    Family History Family History  Problem  Relation Age of Onset   Hypertension Mother    Kidney Stones Mother    Healthy Father    Heart attack Maternal Grandmother    Hyperlipidemia Maternal Grandmother    Heart attack Maternal Grandfather    Heart disease Maternal Grandfather    Diabetes Paternal Grandmother    COPD Paternal Grandmother    Heart disease Paternal Grandmother    Cancer Paternal Grandmother    Cancer Paternal Grandfather    Healthy Son    Healthy Son     Social History Social History   Tobacco Use   Smoking status: Never   Smokeless tobacco: Never  Vaping Use   Vaping Use: Never used  Substance Use Topics   Alcohol use: No   Drug use: No     Allergies   Patient has no known allergies.   Review of Systems Review of Systems  HENT:  Positive for ear pain.   Neurological:  Positive for dizziness.  All other systems reviewed and are negative.   Physical Exam Triage Vital Signs ED Triage Vitals  Enc Vitals Group     BP      Pulse      Resp      Temp      Temp src      SpO2      Weight      Height      Head Circumference      Peak Flow      Pain Score      Pain Loc      Pain Edu?      Excl. in [place]?    No data found.  Updated Vital Signs BP 122/85 (BP Location: Right Arm)    Pulse 78    Temp 98.2 F (36.8 C)    Resp 20    Ht 5\' 8"  (1.727 m)    Wt 235 lb (106.6 kg)    LMP 04/15/2021 (Approximate)    SpO2 98%    BMI 35.73 kg/m      Physical Exam Vitals and nursing note reviewed.  Constitutional:      General: She is not in acute distress.    Appearance: Normal appearance. She is obese. She is not ill-appearing.  HENT:     Head: Normocephalic and atraumatic.     Right Ear: Hearing, tympanic membrane, ear canal and external ear normal.     Left Ear: Hearing, tympanic membrane and external ear normal. There is impacted cerumen.     Ears:     Comments: Left EAC impacted with cerumen unable to visualize left TM; post  ear lavage left EAC-clear, left TM-erythematous, red rimmed,  retracted    Mouth/Throat:     Mouth: Mucous membranes are moist.     Pharynx: Oropharynx is clear.  Eyes:     Extraocular Movements: Extraocular movements intact.     Conjunctiva/sclera: Conjunctivae normal.     Pupils: Pupils are equal, round, and reactive to light.  Cardiovascular:     Rate and Rhythm: Normal rate and regular rhythm.     Pulses: Normal pulses.     Heart sounds: Normal heart sounds.  Pulmonary:     Effort: Pulmonary effort is normal.     Breath sounds: Normal breath sounds.  Musculoskeletal:     Cervical back: Normal range of motion and neck supple.  Skin:    General: Skin is warm and dry.  Neurological:     General: No focal deficit present.     Mental Status: She is alert and oriented to person, place, and time.     UC Treatments / Results  Labs (all labs ordered are listed, but only abnormal results are displayed) Labs Reviewed - No data to display  EKG   Radiology No results found.  Procedures Procedures (including critical care time)  Medications Ordered in UC Medications - No data to display  Initial Impression / Assessment and Plan / UC Course  I have reviewed the triage vital signs and the nursing notes.  Pertinent labs & imaging results that were available during my care of the patient were reviewed by me and considered in my medical decision making (see chart for details).     MDM: 1.  Acute left otitis media-Rx'd Augmentin; 2.  Dizziness-Advised patient if dizziness and/or left sided occipital parietal headache worsens and/or unresolved please go to nearest ED for immediate evaluation to include CT of head and neck.  3.  Impacted cerumen of left ear-resolved with ear lavage. Advised patient to take medication as directed with food to completion.  Encouraged patient to increase daily water intake while taking this medication.   Work note provided prior to discharge.  Patient discharged home, hemodynamically stable. Final Clinical  Impressions(s) / UC Diagnoses   Final diagnoses:  Dizziness  Impacted cerumen of left ear  Acute left otitis media     Discharge Instructions      Advised patient to take medication as directed with food to completion.  Encouraged patient to increase daily water intake while taking this medication.  Advised patient if dizziness and/or left sided occipital parietal headache worsens and/or unresolved please go to nearest ED for immediate evaluation to include CT of head and neck.     ED Prescriptions     Medication Sig Dispense Auth. Provider   amoxicillin-clavulanate (AUGMENTIN) 875-125 MG tablet Take 1 tablet by mouth every 12 (twelve) hours. 14 tablet Eliezer Lofts, FNP   fluconazole (DIFLUCAN) 150 MG tablet Take 1 tab p.o. for vaginal candidiasis, may repeat 1 tab p.o. in 3 days if symptoms are not resolved. 5 tablet Eliezer Lofts, FNP      PDMP not reviewed this encounter.   Eliezer Lofts, [place] 05/06/21 1524

## 2021-05-06 NOTE — Discharge Instructions (Addendum)
Advised patient to take medication as directed with food to completion.  Encouraged patient to increase daily water intake while taking this medication.  Advised patient if dizziness and/or left sided occipital parietal headache worsens and/or unresolved please go to nearest ED for immediate evaluation to include CT of head and neck.

## 2021-06-05 ENCOUNTER — Ambulatory Visit: Payer: 59 | Admitting: Dermatology

## 2021-06-05 ENCOUNTER — Encounter: Payer: Self-pay | Admitting: Dermatology

## 2021-06-05 ENCOUNTER — Other Ambulatory Visit: Payer: Self-pay

## 2021-06-05 DIAGNOSIS — Z85820 Personal history of malignant melanoma of skin: Secondary | ICD-10-CM | POA: Diagnosis not present

## 2021-06-05 DIAGNOSIS — Z1283 Encounter for screening for malignant neoplasm of skin: Secondary | ICD-10-CM | POA: Diagnosis not present

## 2021-06-05 DIAGNOSIS — L729 Follicular cyst of the skin and subcutaneous tissue, unspecified: Secondary | ICD-10-CM

## 2021-06-05 DIAGNOSIS — L853 Xerosis cutis: Secondary | ICD-10-CM | POA: Diagnosis not present

## 2021-06-05 NOTE — Patient Instructions (Signed)
Curel over the counter lotion  ?

## 2021-06-08 DIAGNOSIS — Z124 Encounter for screening for malignant neoplasm of cervix: Secondary | ICD-10-CM | POA: Diagnosis not present

## 2021-06-08 DIAGNOSIS — Z01419 Encounter for gynecological examination (general) (routine) without abnormal findings: Secondary | ICD-10-CM | POA: Diagnosis not present

## 2021-06-08 LAB — HM PAP SMEAR

## 2021-06-14 ENCOUNTER — Other Ambulatory Visit: Payer: Self-pay | Admitting: Family Medicine

## 2021-06-15 ENCOUNTER — Encounter: Payer: Self-pay | Admitting: Family Medicine

## 2021-06-16 ENCOUNTER — Other Ambulatory Visit (HOSPITAL_COMMUNITY): Payer: Self-pay

## 2021-06-19 ENCOUNTER — Other Ambulatory Visit (HOSPITAL_COMMUNITY): Payer: Self-pay

## 2021-06-19 MED ORDER — ATORVASTATIN CALCIUM 40 MG PO TABS
40.0000 mg | ORAL_TABLET | Freq: Every day | ORAL | 2 refills | Status: DC
  Filled 2021-06-19: qty 30, 30d supply, fill #0
  Filled 2021-07-28: qty 30, 30d supply, fill #1

## 2021-06-23 ENCOUNTER — Encounter: Payer: Self-pay | Admitting: Dermatology

## 2021-06-23 NOTE — Progress Notes (Signed)
? ?  Follow-Up Visit ?  ?Subjective  ?Laurie Mullins is a 36 y.o. female who presents for the following: Annual Exam (No new concerns). ? ?General skin examination, history of melanoma, feels like her skin is dry all over ?Location:  ?Duration:  ?Quality:  ?Associated Signs/Symptoms: ?Modifying Factors:  ?Severity:  ?Timing: ?Context:  ? ?Objective  ?Well appearing patient in no apparent distress; mood and affect are within normal limits. ?Full body skin exam h/o mm and atypia, patient has many moles.  Every pigmented lesion checked with dermoscopy, none with atypia. ? ?Patient stated she has dry skin current tx otc cerave cream ? ?Left side back: Unclear scar without repigmentation. ? ?Left Parietal Scalp ?Solitary noninflamed 1.8 cm mobile subcutaneous nodule compatible with pilar cyst per patient pervious biopsy and it was a oil gland ? ? ? ?A full examination was performed including scalp, head, eyes, ears, nose, lips, neck, chest, axillae, abdomen, back, buttocks, bilateral upper extremities, bilateral lower extremities, hands, feet, fingers, toes, fingernails, and toenails. All findings within normal limits unless otherwise noted below. ? ? ?Assessment & Plan  ? ? ?Cyst of skin ?Left Parietal Scalp ? ?Discussed elective removal, no surgery scheduled ? ?Screening exam for skin cancer ? ?Keep yearly skin checks, encouraged to self examine with spouse twice annually.  Continue ultraviolet protection. ? ?Dry skin ? ?Emphasized need for proper skin hydration before use of moisturizer, Curel otc lotion, call if she feels like she needs more treatment ? ?Personal history of malignant melanoma of skin ? ?Keep yearly skin checks ? ? ? ? ? ?I, Lavonna Monarch, MD, have reviewed all documentation for this visit.  The documentation on 06/23/21 for the exam, diagnosis, procedures, and orders are all accurate and complete. ?

## 2021-07-29 ENCOUNTER — Other Ambulatory Visit (HOSPITAL_COMMUNITY): Payer: Self-pay

## 2021-12-04 ENCOUNTER — Encounter: Payer: Self-pay | Admitting: *Deleted

## 2022-01-11 ENCOUNTER — Ambulatory Visit (INDEPENDENT_AMBULATORY_CARE_PROVIDER_SITE_OTHER): Payer: 59 | Admitting: Family Medicine

## 2022-01-11 ENCOUNTER — Encounter: Payer: Self-pay | Admitting: Family Medicine

## 2022-01-11 ENCOUNTER — Other Ambulatory Visit (HOSPITAL_COMMUNITY): Payer: Self-pay

## 2022-01-11 VITALS — BP 100/60 | HR 96 | Temp 98.6°F | Ht 68.0 in | Wt 225.6 lb

## 2022-01-11 DIAGNOSIS — Z136 Encounter for screening for cardiovascular disorders: Secondary | ICD-10-CM

## 2022-01-11 DIAGNOSIS — Z8673 Personal history of transient ischemic attack (TIA), and cerebral infarction without residual deficits: Secondary | ICD-10-CM | POA: Diagnosis not present

## 2022-01-11 DIAGNOSIS — L309 Dermatitis, unspecified: Secondary | ICD-10-CM | POA: Diagnosis not present

## 2022-01-11 DIAGNOSIS — Z0000 Encounter for general adult medical examination without abnormal findings: Secondary | ICD-10-CM

## 2022-01-11 DIAGNOSIS — I7774 Dissection of vertebral artery: Secondary | ICD-10-CM | POA: Diagnosis not present

## 2022-01-11 DIAGNOSIS — Z85820 Personal history of malignant melanoma of skin: Secondary | ICD-10-CM | POA: Diagnosis not present

## 2022-01-11 MED ORDER — TRIAMCINOLONE ACETONIDE 0.1 % EX CREA
1.0000 | TOPICAL_CREAM | Freq: Two times a day (BID) | CUTANEOUS | 0 refills | Status: DC
  Filled 2022-01-11: qty 45, 23d supply, fill #0

## 2022-01-11 NOTE — Progress Notes (Signed)
Subjective  Chief Complaint  Patient presents with   Annual Exam    Pt here for annual exam and is     HPI: Laurie Mullins is a 36 y.o. female who presents to [facility] [place] today for a Female Wellness Visit.  She also has the concerns and/or needs as listed above in the chief complaint. These will be addressed in addition to the Health Maintenance Visit.   Wellness Visit: annual visit with health maintenance review and exam without Pap  HM: Sees Dr. Philis Pique for GYN care.  Last female wellness exam March 30.  Reviewed notes.  Pap smear was normal.  Overall doing well.  Now working day shift, administrative work.  Liking it.  Family as well.  Working on weight loss and exercise.  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  This was traumatic.  She has been released by neurology.  I reviewed most recent note from last year.  Was on Lipitor, there is some question whether she needs to pressure LDL less than 70 given that it was a stroke due to dissection.  She is not having any problems with the Lipitor 10 but would not like to take medicine unless she needs it.  She feels well. Does have a mild rash that comes intermittently in the anterior upper chest wall and neck.  Worse with cold weather.  Burning sensation.  No itching or hives.  History of melanoma and needs to find a new dermatologist.  She has no worrisome lesions at this time. Use your house,  Assessment  1. Annual physical exam   2. History of cerebrovascular accident (CVA) involving cerebellum   3. Vertebral artery dissection (HCC)   4. Eczema, unspecified type   5. History of melanoma      Plan  Female Wellness Visit: Age appropriate Health Maintenance and Prevention measures were discussed with patient. Included topics are cancer screening recommendations, ways to keep healthy (see AVS)  including dietary and exercise recommendations, regular eye and dental care, use of seat belts, and avoidance of moderate alcohol use and tobacco use.  Screens are current BMI: discussed patient's BMI and encouraged positive lifestyle modifications to help get to or maintain a target BMI. HM needs and immunizations were addressed and ordered. See below for orders. See HM and immunization section for updates. Routine labs and screening tests ordered including cmp, cbc and lipids where appropriate. Discussed recommendations regarding Vit D and calcium supplementation (see AVS)  Chronic disease f/u and/or acute problem visit: (deemed necessary to be done in addition to the wellness visit): History of CVA, vertebral artery dissection: Fortunately fully recovered.  Discussed reasons to treat hyperlipidemia.  She will return for fasting labs.  We can consider restarting statin to push LDL less than 70 although given her type of stroke is not exactly clear if this is indicated at this time. History of melanoma and eczema: Triamcinolone cream for the eczema.  She will follow-up with dermatology for annual surveillance of skin cancer  Follow up: Return in about 1 year (around 01/12/2023) for complete physical.   Orders Placed This Encounter  Procedures   CBC with Differential/Platelet   Comprehensive metabolic panel   Hepatitis C antibody   Lipid panel   TSH   Meds ordered this encounter  Medications   triamcinolone cream (KENALOG) 0.1 %    Sig: Apply 1 Application topically  2 (two) times daily. For 2 weeks, then as needed    Dispense:  45 g    Refill:  0       Body mass index is 34.3 kg/m. Wt Readings from Last 3 Encounters:  01/11/22 225 lb 9.6 oz (102.3 kg)  05/06/21 235 lb (106.6 kg)  01/24/21 235 lb (106.6 kg)     Patient Active Problem List   Diagnosis Date Noted   History of cerebrovascular accident (CVA) involving cerebellum 01/11/2022   Vertebral artery dissection ([place])  09/22/2020   History of melanoma 09/22/2020   Cystic acne 11/29/2017   Health Maintenance  Topic Date Due   Hepatitis C Screening  Never done   COVID-19 Vaccine (3 - Pfizer risk series) 01/27/2022 (Originally 12/10/2019)   TETANUS/TDAP  06/06/2024   PAP SMEAR-Modifier  06/09/2026   INFLUENZA VACCINE  Completed   HIV Screening  Completed   HPV VACCINES  Aged Out   Immunization History  Administered Date(s) Administered   Influenza,inj,Quad PF,6+ Mos 11/24/2021   Influenza-Unspecified 12/10/2013, 12/10/2017   PFIZER(Purple Top)SARS-COV-2 Vaccination 10/22/2019, 11/12/2019   Tdap 06/07/2014   We updated and reviewed the patient's past history in detail and it is documented below. Allergies: Patient  reports no history of alcohol use. Past Medical History Patient  has a past medical history of Atypical mole (01/01/2018), Atypical mole (01/01/2018), Atypical mole (01/01/2018), Atypical mole (04/17/2018), Atypical mole (04/17/2018), Atypical mole (04/17/2018), Atypical mole (04/17/2018), Atypical mole (11/03/2018), Atypical mole (11/03/2018), Atypical mole (11/03/2018), Atypical mole (11/03/2018), Atypical mole (05/12/2019), Cystic acne (11/29/2017), Dissection, vertebral artery ([place]), History of chicken pox, History of melanoma (09/22/2020), Melanoma ([place]) (01/01/2018), and Melanoma in situ ([place]) (05/12/2019). Past Surgical History Patient  has a past surgical history that includes Wisdom tooth extraction; Wisdom tooth extraction; and Dilation and evacuation (Bilateral, 03/26/2013). Social History   Socioeconomic History   Marital status: Married    Spouse name: Not on file   Number of children: Not on file   Years of education: Not on file   Highest education level: Not on file  Occupational History    Employer: [employer]  Tobacco Use   Smoking status: Never   Smokeless tobacco: Never  Vaping Use   Vaping Use: Never used  Substance and Sexual Activity   Alcohol use: No   Drug  use: No   Sexual activity: Yes  Other Topics Concern   Not on file  Social History Narrative   Not on file   Social Determinants of Health   Financial Resource Strain: Not on file  Food Insecurity: Not on file  Transportation Needs: Not on file  Physical Activity: Not on file  Stress: Not on file  Social Connections: Not on file   Family History  Problem Relation Age of Onset   Hypertension Mother    Kidney Stones Mother    Healthy Father    Heart attack Maternal Grandmother    Hyperlipidemia Maternal Grandmother    Heart attack Maternal Grandfather    Heart disease Maternal Grandfather    Diabetes Paternal Grandmother    COPD Paternal Grandmother    Heart disease Paternal Grandmother    Cancer Paternal Grandmother    Cancer Paternal Grandfather    Healthy Son    Healthy Son     Review of Systems: Constitutional: negative for fever or malaise Ophthalmic: negative for photophobia, double vision or loss of vision Cardiovascular: negative for chest pain, dyspnea on exertion, or new LE swelling Respiratory: negative for SOB  or persistent cough Gastrointestinal: negative for abdominal pain, change in bowel habits or melena Genitourinary: negative for dysuria or gross hematuria, no abnormal uterine bleeding or disharge Musculoskeletal: negative for new gait disturbance or muscular weakness Integumentary: negative for new or persistent rashes, no breast lumps Neurological: negative for TIA or stroke symptoms Psychiatric: negative for SI or delusions Allergic/Immunologic: negative for hives  Patient Care Team    Relationship Specialty Notifications Start End  Leamon Arnt, MD PCP - General Family Medicine  11/29/17   Garvin Fila, MD Consulting Physician Neurology  09/29/20   Lavonna Monarch, MD (Inactive) Consulting Physician Dermatology  06/05/21     Objective  Vitals: BP 100/60   Pulse 96   Temp 98.6 F (37 C)   Ht '5\' 8"'$  (1.727 m)   Wt 225 lb 9.6 oz (102.3 kg)    SpO2 98%   BMI 34.30 kg/m  General:  Well developed, well nourished, no acute distress  Psych:  Alert and orientedx3,normal mood and affect HEENT:  Normocephalic, atraumatic, non-icteric sclera, PERRL, supple neck without adenopathy, mass or thyromegaly Cardiovascular:  Normal S1, S2, RRR without gallop, rub or murmur Respiratory:  Good breath sounds bilaterally, CTAB with normal respiratory effort Gastrointestinal: normal bowel sounds, soft, non-tender, no noted masses. No HSM MSK: no deformities, contusions. Joints are without erythema or swelling.  Skin:  Warm, no suspicious lesions noted, anterior upper chest with macular erythematous rash without flaking. Neurologic:    Mental status is normal. Gross motor and sensory exams are normal. Normal gait. No tremor    Commons side effects, risks, benefits, and alternatives for medications and treatment plan prescribed today were discussed, and the patient expressed understanding of the given instructions. Patient is instructed to call or message via MyChart if he/she has any questions or concerns regarding our treatment plan. No barriers to understanding were identified. We discussed Red Flag symptoms and signs in detail. Patient expressed understanding regarding what to do in case of urgent or emergency type symptoms.  Medication list was reconciled, printed and provided to the patient in AVS. Patient instructions and summary information was reviewed with the patient as documented in the AVS. This note was prepared with assistance of Dragon voice recognition software. Occasional wrong-word or sound-a-like substitutions may have occurred due to the inherent limitations of voice recognition software .

## 2022-01-11 NOTE — Patient Instructions (Signed)

## 2022-01-12 ENCOUNTER — Other Ambulatory Visit: Payer: 59

## 2022-01-12 DIAGNOSIS — Z0000 Encounter for general adult medical examination without abnormal findings: Secondary | ICD-10-CM | POA: Diagnosis not present

## 2022-01-12 LAB — CBC WITH DIFFERENTIAL/PLATELET
Basophils Absolute: 0 10*3/uL (ref 0.0–0.1)
Basophils Relative: 0.4 % (ref 0.0–3.0)
Eosinophils Absolute: 0.1 10*3/uL (ref 0.0–0.7)
Eosinophils Relative: 1.3 % (ref 0.0–5.0)
HCT: 42.8 % (ref 36.0–46.0)
Hemoglobin: 14.2 g/dL (ref 12.0–15.0)
Lymphocytes Relative: 31.3 % (ref 12.0–46.0)
Lymphs Abs: 2.2 10*3/uL (ref 0.7–4.0)
MCHC: 33.2 g/dL (ref 30.0–36.0)
MCV: 86.8 fl (ref 78.0–100.0)
Monocytes Absolute: 0.4 10*3/uL (ref 0.1–1.0)
Monocytes Relative: 5.6 % (ref 3.0–12.0)
Neutro Abs: 4.4 10*3/uL (ref 1.4–7.7)
Neutrophils Relative %: 61.4 % (ref 43.0–77.0)
Platelets: 184 10*3/uL (ref 150.0–400.0)
RBC: 4.93 Mil/uL (ref 3.87–5.11)
RDW: 12.8 % (ref 11.5–15.5)
WBC: 7.1 10*3/uL (ref 4.0–10.5)

## 2022-01-12 LAB — LIPID PANEL
Cholesterol: 198 mg/dL (ref 0–200)
HDL: 41.5 mg/dL (ref >39.00)
LDL Cholesterol: 141 mg/dL — ABNORMAL HIGH (ref 0–99)
NonHDL: 156.66
Total CHOL/HDL Ratio: 5
Triglycerides: 77 mg/dL (ref 0.0–149.0)
VLDL: 15.4 mg/dL (ref 0.0–40.0)

## 2022-01-12 LAB — COMPREHENSIVE METABOLIC PANEL
ALT: 13 U/L (ref 0–35)
AST: 14 U/L (ref 0–37)
Albumin: 4.1 g/dL (ref 3.5–5.2)
Alkaline Phosphatase: 67 U/L (ref 39–117)
BUN: 11 mg/dL (ref 6–23)
CO2: 25 mEq/L (ref 19–32)
Calcium: 8.7 mg/dL (ref 8.4–10.5)
Chloride: 104 mEq/L (ref 96–112)
Creatinine, Ser: 0.68 mg/dL (ref 0.40–1.20)
GFR: 111.16 mL/min (ref >60.00)
Glucose, Bld: 87 mg/dL (ref 70–99)
Potassium: 4.1 mEq/L (ref 3.5–5.1)
Sodium: 137 mEq/L (ref 135–145)
Total Bilirubin: 0.7 mg/dL (ref 0.2–1.2)
Total Protein: 6.7 g/dL (ref 6.0–8.3)

## 2022-01-12 LAB — TSH: TSH: 1.83 u[IU]/mL (ref 0.35–5.50)

## 2022-01-14 LAB — HEPATITIS C ANTIBODY: Hepatitis C Ab: NONREACTIVE

## 2022-01-15 ENCOUNTER — Encounter (HOSPITAL_COMMUNITY): Payer: Self-pay

## 2022-01-15 ENCOUNTER — Other Ambulatory Visit (HOSPITAL_COMMUNITY): Payer: Self-pay

## 2022-01-16 ENCOUNTER — Encounter: Payer: Self-pay | Admitting: Family Medicine

## 2022-01-25 ENCOUNTER — Encounter: Payer: Self-pay | Admitting: Family Medicine

## 2022-03-27 IMAGING — MR MR MRA HEAD W/O CM
20 of 22 series · 41 of 48 positions shown · IV contrast (gadavist)
Comparison: CT a head and neck 09/21/2020

CLINICAL DATA: Acute neuro deficit. Left vertebral artery
dissection on CTA.

EXAM:
MRI HEAD WITHOUT CONTRAST
MRA HEAD WITHOUT CONTRAST
MRA OF THE NECK WITHOUT AND WITH CONTRAST
TECHNIQUE: Multiplanar, multi-echo pulse sequences of the brain and surrounding
structures were acquired without intravenous contrast. Angiographic
images of the Circle of Willis were acquired using MRA technique
without intravenous contrast. Angiographic images of the neck were
acquired using MRA technique without and with intravenous contrast.
Carotid stenosis measurements (when applicable) are obtained
utilizing NASCET criteria, using the distal internal carotid
diameter as the denominator.
CONTRAST:  10mL GADAVIST GADOBUTROL 1 MMOL/ML IV SOLN

[Series 5: DWI · axial · 3.0mm · 1.36mm/px · z∈[-54,+98]mm · 3 of 104 slices shown (1 of 2)]
[im 1/104]
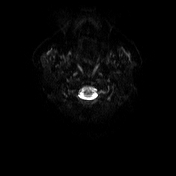
[im 52/104]
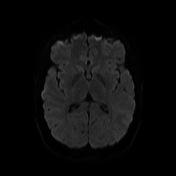
[im 104/104]
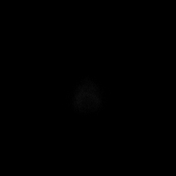

[Series 6: DWI · axial · 3.0mm · 1.36mm/px · 1 of 52 slices shown (2 of 2)]
[im 1/52]
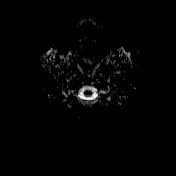

[Series 7: T1 · sagittal · 5.0mm · 0.75mm/px · 1 of 24 slices shown (1 of 2)]
[im 1/24]
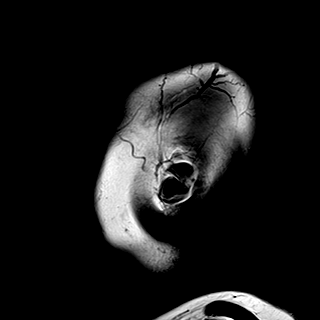

[Series 8: T2 · axial · 5.0mm · 0.62mm/px · 1 of 26 slices shown (1 of 2)]
[im 1/26]
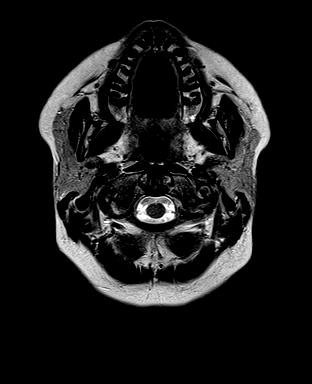

[Series 9: swi_images · axial · 3.0mm · 0.75mm/px · 1 of 52 slices shown]
[im 1/52]
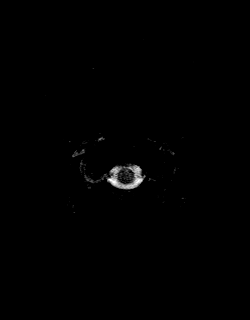

[Series 11: FLAIR · axial · 3.0mm · 0.75mm/px · z∈[-54,+99]mm · 2 of 52 slices shown]
[im 1/52]
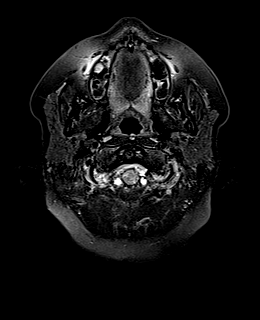
[im 52/52]
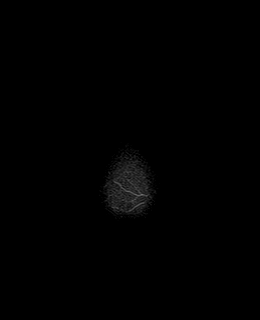

[Series 12: T1 · axial · 1.0mm · 0.94mm/px · z∈[-56,+102]mm · 5 of 160 slices shown (2 of 2)]
[im 1/160]
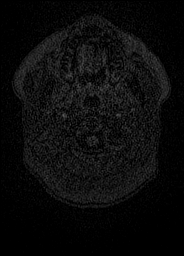
[im 40/160]
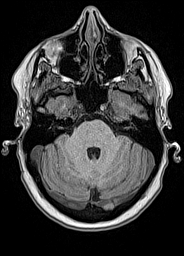
[im 80/160]
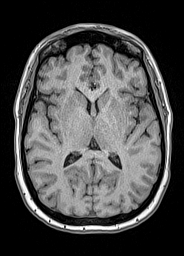
[im 120/160]
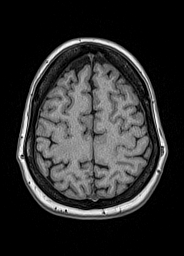
[im 160/160]
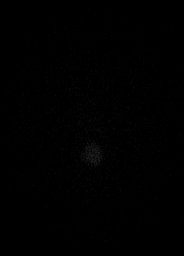

[Series 13: cor dwi_tracew · coronal · 5.0mm · 1.53mm/px · 2 of 48 slices shown]
[im 1/48]
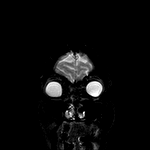
[im 48/48]
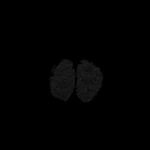

[Series 14: cor dwi_adc · coronal · 5.0mm · 1.53mm/px · 1 of 24 slices shown]
[im 1/24]
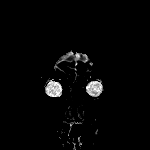

[Series 15: resolve dwi_tracew · axial · 5.0mm · 1.74mm/px · z∈[-62,+94]mm · 2 of 50 slices shown]
[im 1/50]
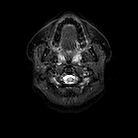
[im 50/50]
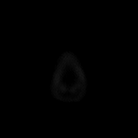

[Series 16: resolve dwi_adc · axial · 5.0mm · 1.74mm/px · 1 of 24 slices shown]
[im 1/24]
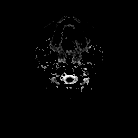

[Series 17: T2 · coronal · 5.0mm · 0.69mm/px · 1 of 30 slices shown (2 of 2)]
[im 1/30]
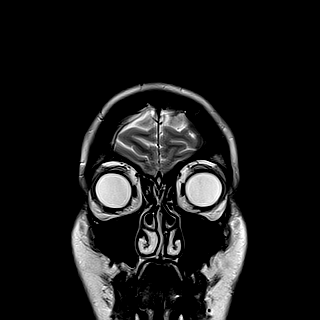

[Series 31: tof_2d_tra · axial · 3.5mm · 0.43mm/px · z∈[-224,-9]mm · 3 of 89 slices shown]
[im 1/89]
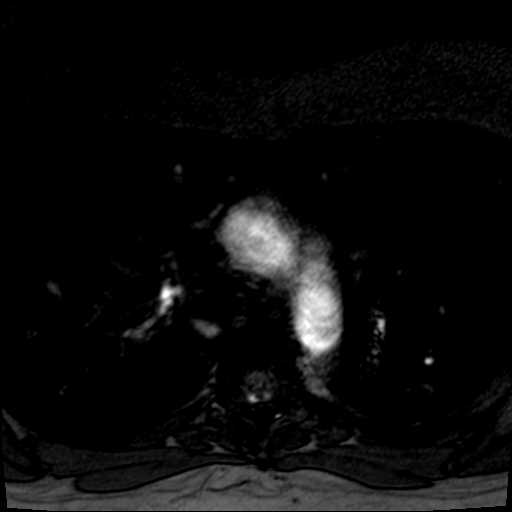
[im 45/89]
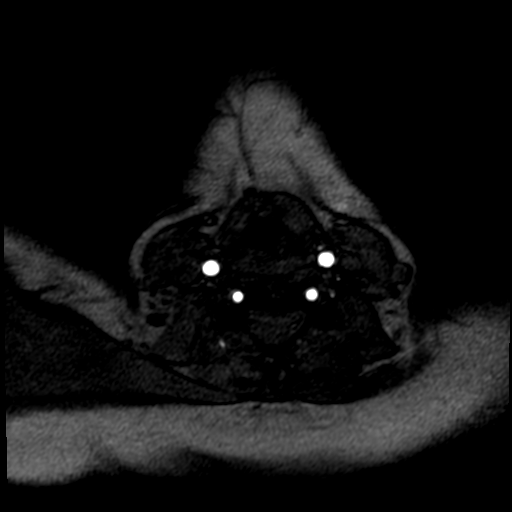
[im 89/89]
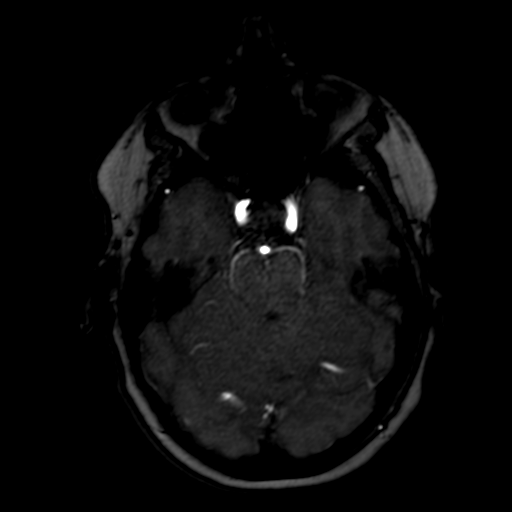

[Series 36: T1 fat-sat · axial · 3.0mm · 0.43mm/px · 1 of 30 slices shown (1 of 2)]
[im 1/30]
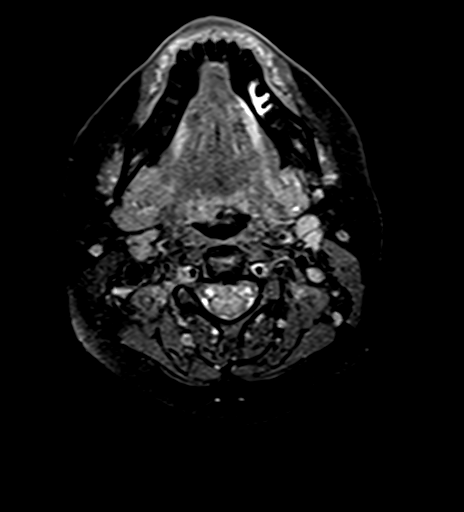

[Series 37: T1 fat-sat · axial · 3.0mm · 0.43mm/px · 1 of 30 slices shown (2 of 2)]
[im 1/30]
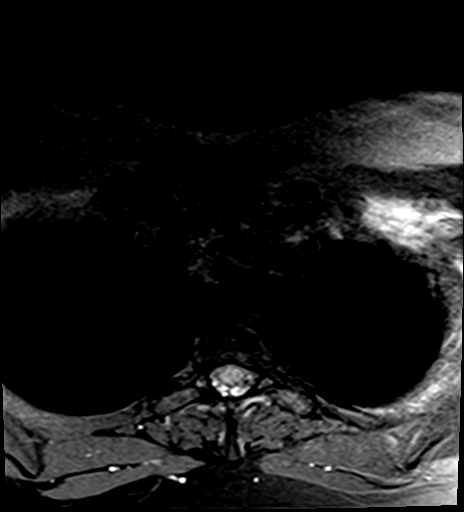

[Series 38: (id)_cor_pre · coronal · 0.8mm · 0.78mm/px · 3 of 88 slices shown]
[im 1/88]
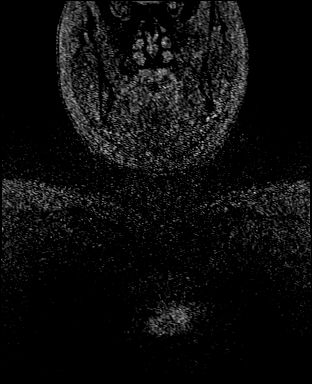
[im 44/88]
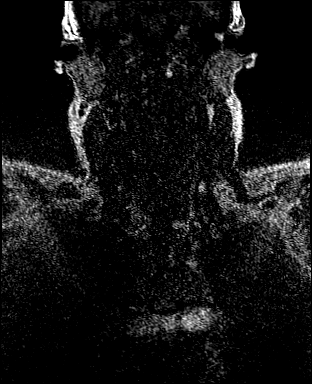
[im 88/88]
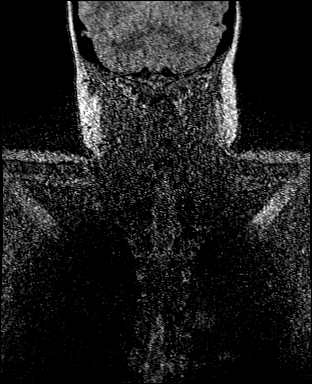

[Series 40: (id)_cor_post · coronal · 0.8mm · 0.78mm/px · 3 of 81 slices shown]
[im 1/81]
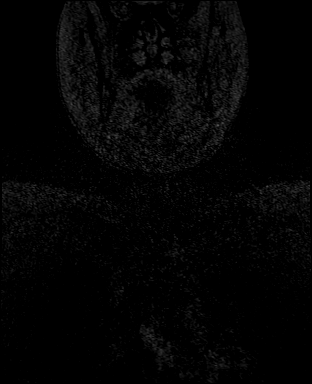
[im 41/81]
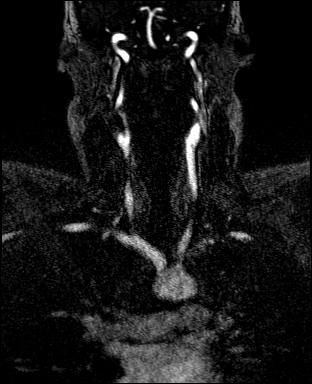
[im 81/81]
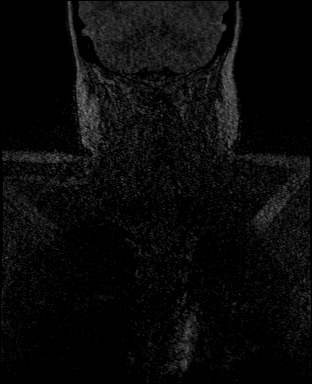

[Series 41: (id)_cor_post_sub · coronal · 0.8mm · 0.78mm/px · 3 of 77 slices shown]
[im 1/77]
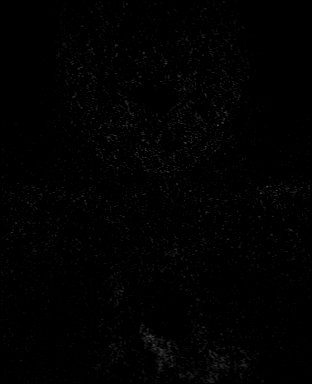
[im 39/77]
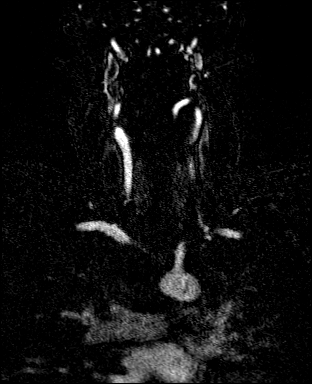
[im 77/77]
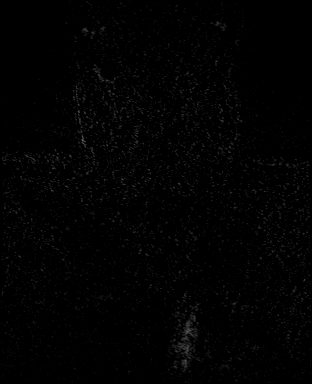

[Series 43: (id)_cor_post_venous · coronal · 0.8mm · 0.78mm/px · 3 of 88 slices shown]
[im 1/88]
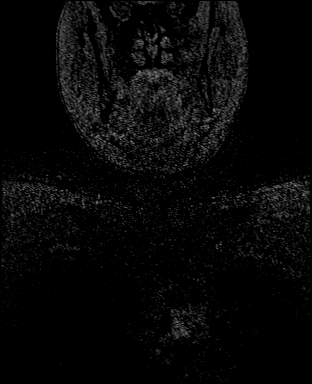
[im 44/88]
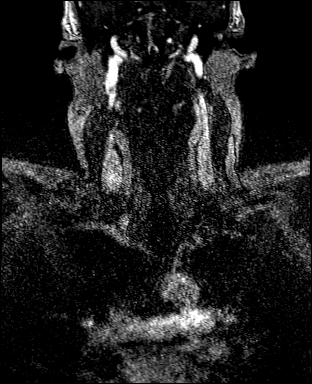
[im 88/88]
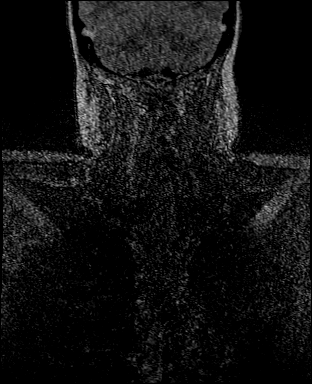

[Series 44: (id)_cor_post_venous_sub · coronal · 0.8mm · 0.78mm/px · 3 of 88 slices shown]
[im 1/88]
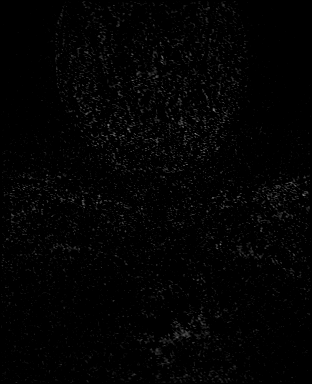
[im 44/88]
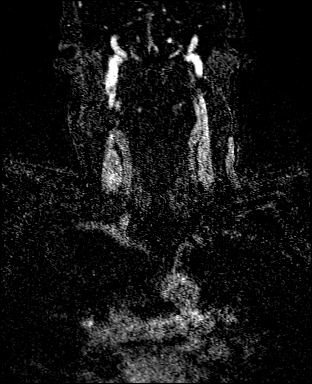
[im 88/88]
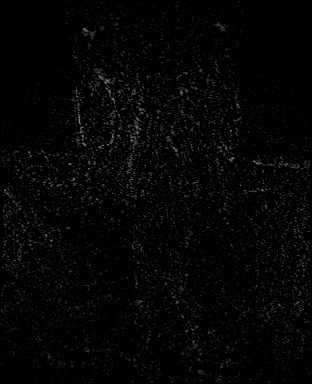

[41 of 48 positions shown; findings below may reference images not displayed]

FINDINGS: MR HEAD FINDINGS

Brain: Negative for acute infarct.

Few small white matter hyperintensities including left
temporoparietal periventricular white matter hyperintensity.
Ventricle size normal. Negative for hemorrhage or mass.

Vascular: Normal arterial flow voids.

Skull and upper cervical spine: Negative

Sinuses/Orbits: Negative

Other: None

MRA HEAD FINDINGS

Anterior circulation: Both vertebral arteries widely patent.
Anterior and middle cerebral arteries normal without stenosis,
occlusion, or aneurysm.

Posterior circulation: CT angiogram demonstrates dissection in the
distal left vertebral artery at the skull base. This is best seen on
axial source image number [id number] with intimal flap and intraluminal
thrombus. This is not causing significant luminal narrowing. Distal
right vertebral artery normal. PICA patent bilaterally. Basilar
widely patent. Superior cerebellar and posterior cerebral arteries
patent bilaterally. No aneurysm.

Anatomic variants:None

MRA NECK FINDINGS

Aortic arch: Aortic arch great normal vessels widely patent in
caliber. Proximal with standard branching of the arch.

Right carotid system: Widely patent right carotid without stenosis
dissection or aneurysm

Left carotid system: Widely patent left carotid without stenosis
dissection or aneurysm.

Vertebral arteries: Both vertebral arteries are widely patent to the
basilar without significant stenosis. Dissection of the distal left
vertebral artery at the skull base is best identified on CT
angiogram and also on the MRA head source images

Other: None
IMPRESSION: 1. Negative for acute infarct.
2. Few small white matter hyperintensities, nonspecific.
Differential includes chronic microvascular ischemia possibly
demyelinating disease.
3. Dissection distal left vertebral artery at the skull base is best
identified by CT angiogram however is also identified on source
images from the MRA head. No significant flow limiting stenosis
although there does appear to be some intraluminal thrombus.
4. No significant carotid or vertebral artery stenosis in the neck.

## 2022-04-18 ENCOUNTER — Telehealth: Payer: Self-pay | Admitting: Family Medicine

## 2022-04-18 ENCOUNTER — Ambulatory Visit
Admission: EM | Admit: 2022-04-18 | Discharge: 2022-04-18 | Disposition: A | Discharge status: Home / Self Care | Payer: 59 | Attending: Urgent Care | Admitting: Urgent Care

## 2022-04-18 DIAGNOSIS — R002 Palpitations: Secondary | ICD-10-CM | POA: Diagnosis not present

## 2022-04-18 NOTE — ED Provider Notes (Signed)
[facility]  Note:  This document was prepared using Systems analyst and may include unintentional dictation errors.  MRN: [id number] DOB: 1985-06-14  Subjective:   Laurie Mullins is a 37 y.o. female presenting for 1 day history of acute onset intermittent heart racing and palpitations.  She has had slight nausea associated with it.  No fever, headache, confusion, weakness, chest pain, numbness or tingling, vomiting, abdominal pain.  No history of arrhythmias.  Has a history of vertebral artery dissection seen 09/21/2020.  Patient has not been hydrating with water at all over the past 3 days.  She drinks coffee and sodas.  But in general has not gotten much fluids.  No current facility-administered medications for this encounter.  Current Outpatient Medications:    aspirin 81 MG EC tablet, Take 1 tablet (81 mg total) by mouth daily. Swallow whole., Disp: 30 tablet, Rfl: 0   atorvastatin (LIPITOR) 40 MG tablet, Take 1 tablet  by mouth daily. (Patient not taking: Reported on 01/11/2022), Disp: 30 tablet, Rfl: 2   fluticasone (FLONASE) 50 MCG/ACT nasal spray, Place 2 sprays into both nostrils daily as needed for allergies., Disp: , Rfl:    Multiple Vitamins-Minerals (ADULT ONE DAILY GUMMIES) CHEW, Chew 1 tablet by mouth daily., Disp: , Rfl:    triamcinolone cream (KENALOG) 0.1 %, Apply topically 2 times daily. For 2 weeks, then as needed, Disp: 45 g, Rfl: 0   No Known Allergies  Past Medical History:  Diagnosis Date   Atypical mole 01/01/2018   moderate atypia - right calf   Atypical mole 01/01/2018   moderate atypia - right buttock   Atypical mole 01/01/2018   moderate atypia - right upper back   Atypical mole 04/17/2018   mild atypia - left mid paraspinal   Atypical mole 04/17/2018   mild atypia - right mid paraspinal   Atypical mole 04/17/2018   mild atypia - right mid back   Atypical mole 04/17/2018   moderate atypia - left upper  back   Atypical mole 11/03/2018   mild atypia - mid spine   Atypical mole 11/03/2018   mild atypia - right outer thigh   Atypical mole 11/03/2018   moderate atypia - left outer forearm   Atypical mole 11/03/2018   moderate atypia - under left breast   Atypical mole 05/12/2019   moderate to severe - right chest (widershave)   Cystic acne 11/29/2017   Dissection, vertebral artery [facility])    July 2022   History of chicken pox    History of melanoma 09/22/2020   Melanoma ([place]) 01/01/2018   melanoma in situ on left side back - excision with margins free   Melanoma in situ ([place]) 05/12/2019   residual MIS on left side back - excision done 06/11/2019     Past Surgical History:  Procedure Laterality Date   DILATION AND EVACUATION Bilateral 03/26/2013   Procedure: DILATATION AND EVACUATION;  Surgeon: Daria Pastures, MD;  Location: [facility];  Service: Gynecology;  Laterality: Bilateral;   WISDOM TOOTH EXTRACTION     WISDOM TOOTH EXTRACTION      Family History  Problem Relation Age of Onset   Hypertension Mother    Kidney Stones Mother    Healthy Father    Heart attack Maternal Grandmother    Hyperlipidemia Maternal Grandmother    Heart attack Maternal Grandfather    Heart disease Maternal Grandfather    Diabetes Paternal Grandmother    COPD Paternal  Grandmother    Heart disease Paternal Grandmother    Cancer Paternal Grandmother    Cancer Paternal Grandfather    Healthy Son    Healthy Son     Social History   Tobacco Use   Smoking status: Never   Smokeless tobacco: Never  Vaping Use   Vaping Use: Never used  Substance Use Topics   Alcohol use: No   Drug use: No    ROS   Objective:   Vitals: BP 138/83 (BP Location: Right Arm)   Pulse 73   Temp 98 F (36.7 C) (Oral)   Resp 18   SpO2 98%   Orthostatic VS for the past 24 hrs:  BP- Lying Pulse- Lying BP- Sitting Pulse- Sitting BP- Standing at 0 minutes Pulse- Standing at 0 minutes  04/18/22 1536 102/67 73  116/76 89 115/71 95   Physical Exam Constitutional:      General: She is not in acute distress.    Appearance: Normal appearance. She is well-developed. She is not ill-appearing, toxic-appearing or diaphoretic.  HENT:     Head: Normocephalic and atraumatic.     Nose: Nose normal.     Mouth/Throat:     Mouth: Mucous membranes are dry.  Eyes:     General: No scleral icterus.       Right eye: No discharge.        Left eye: No discharge.     Extraocular Movements: Extraocular movements intact.  Cardiovascular:     Rate and Rhythm: Normal rate and regular rhythm.     Heart sounds: Normal heart sounds. No murmur heard.    No friction rub. No gallop.  Pulmonary:     Effort: Pulmonary effort is normal. No respiratory distress.     Breath sounds: No stridor. No wheezing, rhonchi or rales.  Chest:     Chest wall: No tenderness.  Skin:    General: Skin is warm and dry.  Neurological:     General: No focal deficit present.     Mental Status: She is alert and oriented to person, place, and time.  Psychiatric:        Mood and Affect: Mood normal.        Behavior: Behavior normal.    ED ECG REPORT   Date: 04/18/2022  EKG Time: 3:17 PM  Rate: 70bpm  Rhythm: normal sinus rhythm,  unchanged from previous tracings  Axis: normal  Intervals:none  ST&T Change: none  Narrative Interpretation: Sinus rhythm at 70 bpm with biatrial enlargement.  ECG appears improved from previous 09/22/2020.  No acute findings.   Assessment and Plan :   PDMP not reviewed this encounter.  1. Palpitations     Lab results reviewed from her recent annual physical.  Patient had no signs of anemia, thyroid disorder, kidney disorder or electrolyte disturbances.  Recommended holding off on repeat blood work.  EKG is very reassuring as well as orthostatic vital signs.  Recommended conservative management including much better hydration and avoidance of sodas altogether.  No signs of a cardiopulmonary event.   Recommended close follow-up with her PCP, maintain strict ER precautions.   Jaynee Eagles, [place] 04/18/22 1554

## 2022-04-18 NOTE — Telephone Encounter (Signed)
Patient states: - She has been feeling heart fluttering sensation since 9 this morning  - No other symptoms; believes acid reflux  - Heartbeat sounded normal but also a little irregular   Patient has been transferred to triage.

## 2022-04-18 NOTE — Discharge Instructions (Signed)
Make sure you hydrate with at least 80 ounces of water daily.  I would avoid sodas altogether.  Limit your coffee to 10 ounces daily.  If you develop chest pain, shortness of breath, a fast pulse that is sustained above 100 bpm then please go to the emergency room.  Otherwise follow-up with your primary care provider and see if they would like for you to see a cardiologist.

## 2022-04-18 NOTE — Telephone Encounter (Signed)
Patient has gone to ED.    Patient Name: Laurie Mullins Gender: Female DOB: 03/30/1985 Age: 37 Y 9 M 20 D Return Phone Number: [phone] (Primary) Address: City/ State/ Zip: [place] [address] Client [facility] Provider Billey Chang- MD Contact Type Call Who Is Calling Patient / Member / Family / Caregiver Call Type Triage / Clinical Relationship To Patient Self Return Phone Number [phone] (Primary) Chief Complaint Heart palpitations or irregular heartbeat Reason for Call Symptomatic / Request for [facility] has a patient who called in stating that she has heart fluttering sensation and her heart rate sounds off. She thought maybe it was heart burn. She has been having heart palpitations. It's not chest palpitation and makes her feel numb. It's not constant. Additional Comment Charge nurse adivised urgent due to them making her feel numb. Translation No Nurse Assessment Nurse: Radford Pax, RN, Eugene Garnet Date/Time Eilene Ghazi Time): 04/18/2022 2:08:07 PM Confirm and document reason for call. If symptomatic, describe symptoms. ---Has been having flutters in chest. Does the patient have any new or worsening symptoms? ---Yes Will a triage be completed? ---Yes Related visit to physician within the last 2 weeks? ---No Does the PT have any chronic conditions? (i.e. diabetes, asthma, this includes High risk factors for pregnancy, etc.) ---No Is the patient pregnant or possibly pregnant? (Ask all females between the ages of 48-55) ---No Is this a behavioral health or substance abuse call? ---No Guidelines Guideline Title Affirmed Question Affirmed Notes Nurse Date/Time (Eastern Time) Heart Rate and Heartbeat Questions New or worsened shortness of breath Turner, RN, Rebekah 04/18/2022 2:09:00 PM PLEASE NOTE: All timestamps contained within this  report are represented as [place] Standard Time. CONFIDENTIALTY NOTICE: This fax transmission is intended only for the addressee. It contains information that is legally privileged, confidential or otherwise protected from use or disclosure. If you are not the intended recipient, you are strictly prohibited from reviewing, disclosing, copying using or disseminating any of this information or taking any action in reliance on or regarding this information. If you have received this fax in error, please notify us immediately by telephone so that we can arrange for its return to [place]. Phone: [phone], Toll-Free: [phone], Fax: [phone] Page: 2 of 2 Call Id: [id number] Guidelines Guideline Title Affirmed Question Affirmed Notes Nurse Date/Time Eilene Ghazi Time) with activity (dyspnea on exertion) Disp. Time Eilene Ghazi Time) Disposition Final User 04/18/2022 2:02:40 PM Send to Urgent Queue Abigail Butts 04/18/2022 2:15:34 PM Go to ED Now (or PCP triage) Yes Radford Pax, RN, Eugene Garnet Final Disposition 04/18/2022 2:15:34 PM Go to ED Now (or PCP triage) Yes Radford Pax, RN, Sharion Settler Disagree/Comply Comply Caller Understands Yes PreDisposition Call Doctor Care Advice Given Per Guideline GO TO ED NOW (OR PCP TRIAGE): * IF NO PCP (PRIMARY CARE PROVIDER) SECOND-LEVEL TRIAGE: You need to be seen within the next hour. Go to the [facility] at _____________ [place] as soon as you can. CARE ADVICE given per Heart Rate and Heartbeat Questions (Adult) guideline. Referrals  Urgent Care at [facility]

## 2022-04-18 NOTE — ED Triage Notes (Signed)
Pt presents with palpitations that started today. Reports feeling nauseous when it happens. Denies any pain.

## 2022-04-25 ENCOUNTER — Ambulatory Visit: Payer: 59 | Attending: Family Medicine

## 2022-04-25 ENCOUNTER — Encounter: Payer: Self-pay | Admitting: Family Medicine

## 2022-04-25 ENCOUNTER — Ambulatory Visit (INDEPENDENT_AMBULATORY_CARE_PROVIDER_SITE_OTHER): Payer: 59 | Admitting: Family Medicine

## 2022-04-25 VITALS — BP 100/60 | HR 94 | Temp 98.2°F | Ht 68.0 in | Wt 226.4 lb

## 2022-04-25 DIAGNOSIS — I491 Atrial premature depolarization: Secondary | ICD-10-CM | POA: Diagnosis not present

## 2022-04-25 DIAGNOSIS — I517 Cardiomegaly: Secondary | ICD-10-CM

## 2022-04-25 DIAGNOSIS — R002 Palpitations: Secondary | ICD-10-CM | POA: Diagnosis not present

## 2022-04-25 NOTE — Progress Notes (Unsigned)
Enrolled for [employer] to mail a ZIO XT long term holter monitor to the patients address on file.   DOD to read. 

## 2022-04-25 NOTE — Progress Notes (Signed)
Subjective  CC:  Chief Complaint  Patient presents with   Palpitations    Pt was seen at the Urgent care on 04/18/2022 for palpitations. She is still feeling the palpitations.     HPI: Laurie Mullins is a 37 y.o. female who presents to the office today to address the problems listed above in the chief complaint. 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  Reports last week had sudden onset of palpitations or feeling her heartbeat.  Symptoms lasted several hours also had some fatigue.  Evaluated at urgent care.  I reviewed notes.  EKG showed normal sinus rhythm with biatrial enlargement which was new when compared to EKG of 2022.  Since, she has stopped all caffeine and has hydrated well however still with palpitations.  Today feeling occasional extra heartbeat.  Does not feel like her heart is racing.  She does not have chest pain or associated shortness of breath.  No diaphoresis or nausea but she does have fatigue and feels a little bit off.  No history of heart disease. Takes asa 64m daily and stopped the lipitor several months ago  Lab Results  Component Value Date   TSH 1.83 01/12/2022   Lab Results  Component Value Date   WBC 7.1 01/12/2022   HGB 14.2 01/12/2022   HCT 42.8 01/12/2022   MCV 86.8 01/12/2022   PLT 184.0 01/12/2022   Lab Results  Component Value Date   CREATININE 0.68 01/12/2022   BUN 11 01/12/2022   NA 137 01/12/2022   K 4.1 01/12/2022   CL 104 01/12/2022   CO2 25 01/12/2022   Lab Results  Component Value Date   CHOL 198 01/12/2022   HDL 41.50 01/12/2022   LDLCALC 141 (H) 01/12/2022   TRIG 77.0 01/12/2022   CHOLHDL 5 01/12/2022    Assessment  1. Palpitations   2. Biatrial enlargement   3. PAC (premature atrial contraction)      Plan  Palpitations and PAC on ekg:  rec monitor and echocardiogram. To ER for chest pain. Continue asa, check labs to ensure no new  anemia/thyroid problems. Further eval depending upon results of work up.   Follow up: prn  Visit date not found  Orders Placed This Encounter  Procedures   CBC with Differential/Platelet   Basic metabolic panel   TSH   LONG TERM MONITOR (3-14 DAYS)   EKG 12-Lead   ECHOCARDIOGRAM COMPLETE   No orders of the defined types were placed in this encounter.     I reviewed the patients updated PMH, FH, and SocHx.    Patient Active Problem List   Diagnosis Date Noted   History of cerebrovascular accident (CVA) involving cerebellum 01/11/2022   Vertebral artery dissection (HCC) 09/22/2020   History of melanoma 09/22/2020   Cystic acne 11/29/2017   Current Meds  Medication Sig   aspirin 81 MG EC tablet Take 1 tablet (81 mg total) by mouth daily. Swallow whole.   fluticasone (FLONASE) 50 MCG/ACT nasal spray Place 2 sprays into both nostrils daily as needed for allergies.   Multiple Vitamins-Minerals (ADULT ONE DAILY GUMMIES) CHEW Chew 1 tablet by mouth daily.    Allergies: Patient has No Known Allergies. Family History: Patient family history includes COPD in her paternal grandmother; Cancer in her paternal grandfather and paternal grandmother; Diabetes in her paternal grandmother; Healthy in her father, son, and son; Heart attack in her maternal grandfather and maternal grandmother; Heart  disease in her maternal grandfather and paternal grandmother; Hyperlipidemia in her maternal grandmother; Hypertension in her mother; Kidney Stones in her mother. Social History:  Patient  reports that she has never smoked. She has never used smokeless tobacco. She reports that she does not drink alcohol and does not use drugs.  Review of Systems: Constitutional: Negative for fever malaise or anorexia Cardiovascular: negative for chest pain Respiratory: negative for SOB or persistent cough Gastrointestinal: negative for abdominal pain  Objective  Vitals: BP 100/60   Pulse 94   Temp 98.2 F  (36.8 C)   Ht 5' 8"$  (1.727 m)   Wt 226 lb 6.4 oz (102.7 kg)   SpO2 99%   BMI 34.42 kg/m  General: no acute distress , A&Ox3 HEENT: PEERL, conjunctiva normal, neck is supple Cardiovascular:  RRR without murmur or gallop. Occ ectopic beat audible and pt confirmed feeling it Respiratory:  Good breath sounds bilaterally, CTAB with normal respiratory effort Skin:  Warm, no rashes  EKG: NSR, atrial enlargement, PACs present w/ associated flip t. No st changes Commons side effects, risks, benefits, and alternatives for medications and treatment plan prescribed today were discussed, and the patient expressed understanding of the given instructions. Patient is instructed to call or message via MyChart if he/she has any questions or concerns regarding our treatment plan. No barriers to understanding were identified. We discussed Red Flag symptoms and signs in detail. Patient expressed understanding regarding what to do in case of urgent or emergency type symptoms.  Medication list was reconciled, printed and provided to the patient in AVS. Patient instructions and summary information was reviewed with the patient as documented in the AVS. This note was prepared with assistance of Dragon voice recognition software. Occasional wrong-word or sound-a-like substitutions may have occurred due to the inherent limitations of voice recognition software

## 2022-04-25 NOTE — Patient Instructions (Addendum)
Please follow up if symptoms do not improve or as needed.    I will release your lab results to you on your MyChart account with further instructions. You may see the results before I do, but when I review them I will send you a message with my report or have my assistant call you if things need to be discussed. Please reply to my message with any questions. Thank you!   We will get you set up for a heart monitor and an echocardiogram.   Palpitations Palpitations are feelings that your heartbeat is irregular or is faster than normal. It may feel like your heart is fluttering or skipping a beat. Palpitations may be caused by many things, including smoking, caffeine, alcohol, stress, and certain medicines or drugs. Most causes of palpitations are not serious.  However, some palpitations can be a sign of a serious problem. Further tests and a thorough medical history will be done to find the cause of your palpitations. Your provider may order tests such as an ECG, labs, an echocardiogram, or an ambulatory continuous ECG monitor. Follow these instructions at home: Pay attention to any changes in your symptoms. Let your health care provider know about them. Take these actions to help manage your symptoms: Eating and drinking Follow instructions from your health care provider about eating or drinking restrictions. You may need to avoid foods and drinks that may cause palpitations. These may include: Caffeinated coffee, tea, soft drinks, and energy drinks. Chocolate. Alcohol. Diet pills. Lifestyle     Take steps to reduce your stress and anxiety. Things that can help you relax include: Yoga. Mind-body activities, such as deep breathing, meditation, or using words and images to create positive thoughts (guided imagery). Physical activity, such as swimming, jogging, or walking. Tell your health care provider if your palpitations increase with activity. If you have chest pain or shortness of breath  with activity, do not continue the activity until you are seen by your health care provider. Biofeedback. This is a method that helps you learn to use your mind to control things in your body, such as your heartbeat. Get plenty of rest and sleep. Keep a regular bed time. Do not use drugs, including cocaine or ecstasy. Do not use marijuana. Do not use any products that contain nicotine or tobacco. These products include cigarettes, chewing tobacco, and vaping devices, such as e-cigarettes. If you need help quitting, ask your health care provider. General instructions Take over-the-counter and prescription medicines only as told by your health care provider. Keep all follow-up visits. This is important. These may include visits for further testing if palpitations do not go away or get worse. Contact a health care provider if: You continue to have a fast or irregular heartbeat for a long period of time. You notice that your palpitations occur more often. Get help right away if: You have chest pain or shortness of breath. You have a severe headache. You feel dizzy or you faint. These symptoms may represent a serious problem that is an emergency. Do not wait to see if the symptoms will go away. Get medical help right away. Call your local emergency services (911 in the [place] Do not drive yourself to the hospital. Summary Palpitations are feelings that your heartbeat is irregular or is faster than normal. It may feel like your heart is fluttering or skipping a beat. Palpitations may be caused by many things, including smoking, caffeine, alcohol, stress, certain medicines, and drugs. Further tests and a  thorough medical history may be done to find the cause of your palpitations. Get help right away if you faint or have chest pain, shortness of breath, severe headache, or dizziness. This information is not intended to replace advice given to you by your health care provider. Make sure you discuss any  questions you have with your health care provider. Document Revised: 07/20/2020 Document Reviewed: 07/20/2020 Elsevier Patient Education  [place].

## 2022-04-26 LAB — CBC WITH DIFFERENTIAL/PLATELET
Basophils Absolute: 0.1 10*3/uL (ref 0.0–0.1)
Basophils Relative: 0.7 % (ref 0.0–3.0)
Eosinophils Absolute: 0.1 10*3/uL (ref 0.0–0.7)
Eosinophils Relative: 1 % (ref 0.0–5.0)
HCT: 44 % (ref 36.0–46.0)
Hemoglobin: 15 g/dL (ref 12.0–15.0)
Lymphocytes Relative: 33.3 % (ref 12.0–46.0)
Lymphs Abs: 2.8 10*3/uL (ref 0.7–4.0)
MCHC: 34.2 g/dL (ref 30.0–36.0)
MCV: 86.3 fl (ref 78.0–100.0)
Monocytes Absolute: 0.5 10*3/uL (ref 0.1–1.0)
Monocytes Relative: 6.5 % (ref 3.0–12.0)
Neutro Abs: 4.9 10*3/uL (ref 1.4–7.7)
Neutrophils Relative %: 58.5 % (ref 43.0–77.0)
Platelets: 222 10*3/uL (ref 150.0–400.0)
RBC: 5.09 Mil/uL (ref 3.87–5.11)
RDW: 13.1 % (ref 11.5–15.5)
WBC: 8.4 10*3/uL (ref 4.0–10.5)

## 2022-04-26 LAB — BASIC METABOLIC PANEL
BUN: 12 mg/dL (ref 6–23)
CO2: 27 mEq/L (ref 19–32)
Calcium: 9 mg/dL (ref 8.4–10.5)
Chloride: 103 mEq/L (ref 96–112)
Creatinine, Ser: 0.93 mg/dL (ref 0.40–1.20)
GFR: 78.34 mL/min (ref >60.00)
Glucose, Bld: 75 mg/dL (ref 70–99)
Potassium: 4.1 mEq/L (ref 3.5–5.1)
Sodium: 139 mEq/L (ref 135–145)

## 2022-04-26 LAB — TSH: TSH: 1.58 u[IU]/mL (ref 0.35–5.50)

## 2022-05-01 DIAGNOSIS — R002 Palpitations: Secondary | ICD-10-CM | POA: Diagnosis not present

## 2022-05-01 DIAGNOSIS — I517 Cardiomegaly: Secondary | ICD-10-CM | POA: Diagnosis not present

## 2022-05-15 DIAGNOSIS — R002 Palpitations: Secondary | ICD-10-CM | POA: Diagnosis not present

## 2022-05-15 DIAGNOSIS — I517 Cardiomegaly: Secondary | ICD-10-CM | POA: Diagnosis not present

## 2022-05-16 ENCOUNTER — Encounter: Payer: Self-pay | Admitting: Family Medicine

## 2022-05-16 DIAGNOSIS — I493 Ventricular premature depolarization: Secondary | ICD-10-CM | POA: Insufficient documentation

## 2022-05-16 DIAGNOSIS — R002 Palpitations: Secondary | ICD-10-CM | POA: Insufficient documentation

## 2022-05-17 ENCOUNTER — Encounter: Payer: Self-pay | Admitting: Family Medicine

## 2022-05-22 ENCOUNTER — Ambulatory Visit (HOSPITAL_COMMUNITY): Payer: 59 | Attending: Family Medicine

## 2022-05-22 DIAGNOSIS — R002 Palpitations: Secondary | ICD-10-CM

## 2022-05-22 DIAGNOSIS — I517 Cardiomegaly: Secondary | ICD-10-CM | POA: Diagnosis not present

## 2022-05-22 LAB — ECHOCARDIOGRAM COMPLETE
Area-P 1/2: 4.18 cm2
S' Lateral: 3 cm

## 2022-05-29 ENCOUNTER — Other Ambulatory Visit: Payer: Self-pay

## 2022-05-29 DIAGNOSIS — R002 Palpitations: Secondary | ICD-10-CM

## 2022-05-29 DIAGNOSIS — I517 Cardiomegaly: Secondary | ICD-10-CM

## 2022-05-29 NOTE — Progress Notes (Signed)
Please call patient: I have reviewed his/her echocardiogram results. Please call patient; because she is having persistent palpitations and her echocardiogram shows a mildly enlarged right lower heart chamber, I'd like her to see the heart specialist just to be sure. Please refer to cards for palpitations and enlarged right ventricle. thanks

## 2022-05-29 NOTE — Progress Notes (Unsigned)
Cardiology Office Note:    Date:  05/31/2022   ID:  Laurie Mullins, Laurie Mullins 01/30/1986, MRN [id number]  PCP:  Leamon Arnt, MD   [facility] Cardiologist:  Lenna Sciara, MD Referring MD: Leamon Arnt, MD   Chief Complaint/Reason for Referral: Palpitations  ASSESSMENT:    1. Palpitations   2. Vertebral artery dissection ([place])   3. CKD (chronic kidney disease) stage 2, GFR 60-89 ml/min     PLAN:    In order of problems listed above: 1.  Palpitations: The patient had a monitor which demonstrated occasional PVCs.  We had a long discussion about potential pharmacotherapy versus monitoring versus a repeat monitor.  I do not think a repeat monitor would be of much utility.  For now the patient will monitor her symptoms.  If they become bothersome we will consider as needed beta-blocker. 2.  Vertebral artery dissection: Patient is on aspirin monotherapy.  Repeat CT scan demonstrated complete resolution. 3.  CKD: Monitor for now. 4.  RV dilatation:  Will obtain CMR to evaluate further.  If RV dilatation confirmed by CMR and no intra or extracardiac shunt is detected we will consider right heart catheterization for further evaluation.             Dispo:  Return in about 6 months (around 12/01/2022).      Medication Adjustments/Labs and Tests Ordered: Current medicines are reviewed at length with the patient today.  Concerns regarding medicines are outlined above.  The following changes have been made:  no change   Labs/tests ordered: Orders Placed This Encounter  Procedures   MR CARDIAC MORPHOLOGY W WO CONTRAST    Medication Changes: No orders of the defined types were placed in this encounter.    Current medicines are reviewed at length with the patient today.  The patient does not have concerns regarding medicines.   History of Present Illness:    FOCUSED PROBLEM LIST:   1.  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   CKD stage II  The patient is a 37 y.o. female with the indicated medical history here for recommendations regarding palpitations.  She was seen by her PCP recently.  She had an episode lasting several hours of palpitations and fatigue.  The monitor which demonstrated a very low frequency of ventricular and supraventricular ectopy.  Her echocardiogram that I reviewed demonstrates normal LV function with no significant valvular abnormalities with moderate RV dilatation..  A TSH was drawn last month which was within normal limits.  She continues to be bothered by palpitations.  They can happen for several days.  They happen yesterday.  They do not seem to be associated with caffeine intake or significant stress levels.  They are not associated with chest pain or shortness of breath.  Denies associated with presyncope or syncope.  They seem to be associated with her menstrual cycles however.  She denies any exertional chest pain, signs or symptoms of recurrent stroke, peripheral edema, or paroxysmal nocturnal dyspnea.  She does not smoke and she drinks only occasionally.  She does not snore and does not suffer from some daytime somnolence.         Current Medications: Current Meds  Medication Sig   aspirin 81 MG EC tablet Take 1 tablet (81 mg total) by mouth daily. Swallow whole.   fluticasone (FLONASE) 50 MCG/ACT nasal spray Place 2 sprays into both nostrils daily as needed for allergies.   Multiple  Vitamins-Minerals (ADULT ONE DAILY GUMMIES) CHEW Chew 1 tablet by mouth daily.     Allergies:    Patient has no known allergies.   Social History:   Social History   Tobacco Use   Smoking status: Never   Smokeless tobacco: Never  Vaping Use   Vaping Use: Never used  Substance Use Topics   Alcohol use: No   Drug use: No     Family Hx: Family History  Problem Relation Age of Onset   Hypertension Mother    Kidney Stones Mother    Healthy Father    Heart attack Maternal Grandmother     Hyperlipidemia Maternal Grandmother    Heart attack Maternal Grandfather    Heart disease Maternal Grandfather    Diabetes Paternal Grandmother    COPD Paternal Grandmother    Heart disease Paternal Grandmother    Cancer Paternal Grandmother    Cancer Paternal Grandfather    Healthy Son    Healthy Son      Review of Systems:   Please see the history of present illness.    All other systems reviewed and are negative.     EKGs/Labs/Other Test Reviewed:    EKG:  EKG performed February 2024 that I personally reviewed demonstrates sinus rhythm with PACs with RSR prime  Prior CV studies:  Cardiac Studies & Procedures       ECHOCARDIOGRAM  ECHOCARDIOGRAM COMPLETE 05/22/2022  Narrative ECHOCARDIOGRAM REPORT    Patient Name:   Laurie Mullins Date of Exam: 05/22/2022 Medical Rec #:  [id number]          Height:       68.0 in Accession #:    [id number]         Weight:       226.4 lb Date of Birth:  Jun 20, 1985          BSA:          2.154 m Patient Age:    42 years           BP:           100/60 mmHg Patient Gender: F                  HR:           75 bpm. Exam Location:  [facility]  Procedure: 2D Echo, Cardiac Doppler and Color Doppler  Indications:    R00.2 Palpitations  History:        Patient has no prior history of Echocardiogram examinations.  Sonographer:    Wilford Sports Rodgers-Jones RDCS Referring Phys: [id number] CAMILLE L ANDY  IMPRESSIONS   1. Left ventricular ejection fraction, by estimation, is 55 to 60%. The left ventricle has normal function. The left ventricle has no regional wall motion abnormalities. Left ventricular diastolic parameters were normal. 2. Right ventricular systolic function is normal. The right ventricular size is mild to moderately enlarged. Tricuspid regurgitation signal is inadequate for assessing PA pressure. 3. The mitral valve is normal in structure. No evidence of mitral valve regurgitation. No evidence of mitral stenosis. 4. The aortic  valve is tricuspid. Aortic valve regurgitation is not visualized. No aortic stenosis is present. 5. The inferior vena cava is normal in size with greater than 50% respiratory variability, suggesting right atrial pressure of 3 mmHg.  Comparison(s): No prior Echocardiogram.  FINDINGS Left Ventricle: Left ventricular ejection fraction, by estimation, is 55 to 60%. The left ventricle has normal function. The left ventricle has no regional wall  motion abnormalities. The left ventricular internal cavity size was normal in size. There is no left ventricular hypertrophy. Left ventricular diastolic parameters were normal.  Right Ventricle: The right ventricular size is mild to moderately enlarged. No increase in right ventricular wall thickness. Right ventricular systolic function is normal. Tricuspid regurgitation signal is inadequate for assessing PA pressure.  Left Atrium: Left atrial size was normal in size.  Right Atrium: Right atrial size was normal in size.  Pericardium: There is no evidence of pericardial effusion.  Mitral Valve: The mitral valve is normal in structure. No evidence of mitral valve regurgitation. No evidence of mitral valve stenosis.  Tricuspid Valve: The tricuspid valve is normal in structure. Tricuspid valve regurgitation is not demonstrated. No evidence of tricuspid stenosis.  Aortic Valve: The aortic valve is tricuspid. Aortic valve regurgitation is not visualized. No aortic stenosis is present.  Pulmonic Valve: The pulmonic valve was not well visualized. Pulmonic valve regurgitation is not visualized.  Aorta: The aortic root and ascending aorta are structurally normal, with no evidence of dilitation.  Venous: The inferior vena cava is normal in size with greater than 50% respiratory variability, suggesting right atrial pressure of 3 mmHg.  IAS/Shunts: The atrial septum is grossly normal.   LEFT VENTRICLE PLAX 2D LVIDd:         4.80 cm   Diastology LVIDs:          3.00 cm   LV e' medial:    12.95 cm/s LV PW:         0.70 cm   LV E/e' medial:  7.9 LV IVS:        0.70 cm   LV e' lateral:   18.80 cm/s LVOT diam:     2.10 cm   LV E/e' lateral: 5.5 LV SV:         71 LV SV Index:   33 LVOT Area:     3.46 cm   RIGHT VENTRICLE             IVC RV Basal diam:  4.50 cm     IVC diam: 1.50 cm RV S prime:     16.93 cm/s TAPSE (M-mode): 3.0 cm  LEFT ATRIUM             Index        RIGHT ATRIUM           Index LA diam:        3.70 cm 1.72 cm/m   RA Area:     13.90 cm LA Vol (A2C):   51.3 ml 23.81 ml/m  RA Volume:   35.40 ml  16.43 ml/m LA Vol (A4C):   26.2 ml 12.16 ml/m LA Biplane Vol: 37.7 ml 17.50 ml/m AORTIC VALVE LVOT Vmax:   108.00 cm/s LVOT Vmean:  67.750 cm/s LVOT VTI:    0.204 m  AORTA Ao Root diam: 3.10 cm Ao Asc diam:  3.00 cm  MITRAL VALVE MV Area (PHT): 4.18 cm     SHUNTS MV Decel Time: 182 msec     Systemic VTI:  0.20 m MV E velocity: 102.50 cm/s  Systemic Diam: 2.10 cm MV A velocity: 68.15 cm/s MV E/A ratio:  1.50  Rudean Haskell MD Electronically signed by Rudean Haskell MD Signature Date/Time: 05/22/2022/5:59:01 PM    Final    MONITORS  LONG TERM MONITOR (3-14 DAYS) 05/16/2022  Narrative Patch Wear Time:  7 days and 1 hours  Predominant rhythm was sinus rhythm Less than 1% ventricular  and supraventricular ectopy Triggered episodes associated with sinus rhythm and single PVCs  Will Camnitz, MD           Other studies Reviewed: Review of the additional studies/records demonstrates: No aortic atherosclerosis on CT scan 2022  Recent Labs: 01/12/2022: ALT 13 04/25/2022: BUN 12; Creatinine, Ser 0.93; Hemoglobin 15.0; Platelets 222.0; Potassium 4.1; Sodium 139; TSH 1.58   Recent Lipid Panel Lab Results  Component Value Date/Time   CHOL 198 01/12/2022 07:53 AM   CHOL 141 01/24/2021 09:57 AM   TRIG 77.0 01/12/2022 07:53 AM   HDL 41.50 01/12/2022 07:53 AM   HDL 50 01/24/2021 09:57 AM   LDLCALC 141  (H) 01/12/2022 07:53 AM   LDLCALC 78 01/24/2021 09:57 AM    Risk Assessment/Calculations:                Physical Exam:    VS:  BP 124/76   Pulse 87   Ht 5\' 8"  (1.727 m)   Wt 226 lb (102.5 kg)   SpO2 98%   BMI 34.36 kg/m    Wt Readings from Last 3 Encounters:  05/31/22 226 lb (102.5 kg)  04/25/22 226 lb 6.4 oz (102.7 kg)  01/11/22 225 lb 9.6 oz (102.3 kg)    GENERAL:  No apparent distress, AOx3 HEENT:  No carotid bruits, +2 carotid impulses, no scleral icterus CAR: RRR no murmurs, gallops, rubs, or thrills RES:  Clear to auscultation bilaterally ABD:  Soft, nontender, nondistended, positive bowel sounds x 4 VASC:  +2 radial pulses, +2 carotid pulses, palpable pedal pulses NEURO:  CN 2-12 grossly intact; motor and sensory grossly intact PSYCH:  No active depression or anxiety EXT:  No edema, ecchymosis, or cyanosis  Signed, Early Osmond, MD  05/31/2022 9:48 AM    [address], [place], [place]  [address] Phone: [phone]; Fax: [phone]   Note:  This document was prepared using Dragon voice recognition software and may include unintentional dictation errors.

## 2022-05-31 ENCOUNTER — Ambulatory Visit: Payer: 59 | Attending: Internal Medicine | Admitting: Internal Medicine

## 2022-05-31 ENCOUNTER — Encounter: Payer: Self-pay | Admitting: Internal Medicine

## 2022-05-31 VITALS — BP 124/76 | HR 87 | Ht 68.0 in | Wt 226.0 lb

## 2022-05-31 DIAGNOSIS — R002 Palpitations: Secondary | ICD-10-CM

## 2022-05-31 DIAGNOSIS — N182 Chronic kidney disease, stage 2 (mild): Secondary | ICD-10-CM

## 2022-05-31 DIAGNOSIS — I7774 Dissection of vertebral artery: Secondary | ICD-10-CM

## 2022-05-31 NOTE — Patient Instructions (Addendum)
Medication Instructions:  Your physician recommends that you continue on your current medications as directed. Please refer to the Current Medication list given to you today.  *If you need a refill on your cardiac medications before your next appointment, please call your pharmacy*  Testing/Procedures:  Your physician has requested that you have a cardiac MRI. Cardiac MRI uses a computer to create images of your heart as its beating, producing both still and moving pictures of your heart and major blood vessels. For further information please visit [email or web address] Please follow the instruction sheet given to you today for more information.    Follow-Up: At [facility], you and your health needs are our priority.  As part of our continuing mission to provide you with exceptional heart care, we have created designated Provider Care Teams.  These Care Teams include your primary Cardiologist (physician) and Advanced Practice Providers (APPs -  Physician Assistants and Nurse Practitioners) who all work together to provide you with the care you need, when you need it.  Your next appointment:   6 month(s)  Provider:   Dr. Ali Lowe

## 2022-06-04 ENCOUNTER — Encounter: Payer: Self-pay | Admitting: Family Medicine

## 2022-06-06 ENCOUNTER — Ambulatory Visit: Payer: 59 | Admitting: Dermatology

## 2022-07-25 DIAGNOSIS — D2271 Melanocytic nevi of right lower limb, including hip: Secondary | ICD-10-CM | POA: Diagnosis not present

## 2022-07-25 DIAGNOSIS — D225 Melanocytic nevi of trunk: Secondary | ICD-10-CM | POA: Diagnosis not present

## 2022-07-25 DIAGNOSIS — D224 Melanocytic nevi of scalp and neck: Secondary | ICD-10-CM | POA: Diagnosis not present

## 2022-07-25 DIAGNOSIS — Z85820 Personal history of malignant melanoma of skin: Secondary | ICD-10-CM | POA: Diagnosis not present

## 2022-07-25 DIAGNOSIS — D2261 Melanocytic nevi of right upper limb, including shoulder: Secondary | ICD-10-CM | POA: Diagnosis not present

## 2022-07-25 DIAGNOSIS — L813 Cafe au lait spots: Secondary | ICD-10-CM | POA: Diagnosis not present

## 2022-07-25 DIAGNOSIS — D2272 Melanocytic nevi of left lower limb, including hip: Secondary | ICD-10-CM | POA: Diagnosis not present

## 2022-07-25 DIAGNOSIS — D485 Neoplasm of uncertain behavior of skin: Secondary | ICD-10-CM | POA: Diagnosis not present

## 2022-07-25 DIAGNOSIS — D2262 Melanocytic nevi of left upper limb, including shoulder: Secondary | ICD-10-CM | POA: Diagnosis not present

## 2022-08-15 ENCOUNTER — Telehealth (HOSPITAL_COMMUNITY): Payer: Self-pay | Admitting: *Deleted

## 2022-08-15 NOTE — Telephone Encounter (Signed)
Reaching out to patient to offer assistance regarding upcoming cardiac imaging study; pt verbalizes understanding of appt date/time, parking situation and where to check in, and verified current allergies; name and call back number provided for further questions should they arise  Aime Carreras RN Navigator Cardiac Imaging [facility] [phone] office [phone] cell  Patient reports having an MRI in the past without incident. 

## 2022-08-15 NOTE — Telephone Encounter (Signed)
Attempted to call patient regarding upcoming cardiac MRI appointment. Left message on voicemail with name and callback number  Oumar Marcott RN Navigator Cardiac Imaging  [facility] [phone] Office [phone] Cell  

## 2022-08-16 ENCOUNTER — Other Ambulatory Visit: Payer: Self-pay | Admitting: Internal Medicine

## 2022-08-16 ENCOUNTER — Ambulatory Visit (HOSPITAL_COMMUNITY)
Admission: RE | Admit: 2022-08-16 | Discharge: 2022-08-16 | Disposition: A | Discharge status: Home / Self Care | Payer: 59 | Source: Ambulatory Visit | Attending: Internal Medicine | Admitting: Internal Medicine

## 2022-08-16 DIAGNOSIS — R002 Palpitations: Secondary | ICD-10-CM

## 2022-08-16 DIAGNOSIS — N182 Chronic kidney disease, stage 2 (mild): Secondary | ICD-10-CM | POA: Diagnosis present

## 2022-08-16 DIAGNOSIS — I7774 Dissection of vertebral artery: Secondary | ICD-10-CM

## 2022-08-16 MED ORDER — GADOBUTROL 1 MMOL/ML IV SOLN
10.0000 mL | Freq: Once | INTRAVENOUS | Status: AC | PRN
  Administered 2022-08-16: 10 mL via INTRAVENOUS

## 2022-08-19 ENCOUNTER — Encounter: Payer: Self-pay | Admitting: Internal Medicine

## 2022-08-19 DIAGNOSIS — R002 Palpitations: Secondary | ICD-10-CM

## 2022-08-22 ENCOUNTER — Ambulatory Visit: Payer: 59 | Attending: Internal Medicine

## 2022-08-22 DIAGNOSIS — R002 Palpitations: Secondary | ICD-10-CM

## 2022-08-22 NOTE — Progress Notes (Unsigned)
Enrolled patient for a 7 day Zio XT monitor to be mailed to patients home.  

## 2022-08-22 NOTE — Telephone Encounter (Signed)
Left detailed message on self identified VM to arrange day for blood work appointment to check magnesium level and that a monitor will be sent to her to wear that week before her cycle.  Adv I would send via the portal as well.

## 2022-08-23 ENCOUNTER — Ambulatory Visit: Payer: 59 | Attending: Internal Medicine

## 2022-08-23 DIAGNOSIS — R002 Palpitations: Secondary | ICD-10-CM | POA: Diagnosis not present

## 2022-08-24 LAB — MAGNESIUM: Magnesium: 1.9 mg/dL (ref 1.6–2.3)

## 2022-08-30 DIAGNOSIS — R002 Palpitations: Secondary | ICD-10-CM | POA: Diagnosis not present

## 2022-09-11 DIAGNOSIS — R002 Palpitations: Secondary | ICD-10-CM | POA: Diagnosis not present

## 2022-09-12 ENCOUNTER — Telehealth: Payer: Self-pay | Admitting: *Deleted

## 2022-09-12 ENCOUNTER — Other Ambulatory Visit (HOSPITAL_COMMUNITY): Payer: Self-pay

## 2022-09-12 ENCOUNTER — Other Ambulatory Visit: Payer: Self-pay

## 2022-09-12 MED ORDER — METOPROLOL SUCCINATE ER 25 MG PO TB24
25.0000 mg | ORAL_TABLET | Freq: Every day | ORAL | 3 refills | Status: DC
  Filled 2022-09-12: qty 90, 90d supply, fill #0
  Filled 2022-12-06: qty 90, 90d supply, fill #1
  Filled 2023-03-28: qty 90, 90d supply, fill #2
  Filled 2023-08-26: qty 90, 90d supply, fill #3

## 2022-09-12 NOTE — Telephone Encounter (Signed)
Spoke w patient re: monitor results and recommendations to start Toprol XL 25 mg daily at bedtime.  Discussed to let us know if she does not tolerate.  She will take daily but asked if MD would want her to take it just around the times of her cycle that palpitations are most noticeable.  I asked her to take daily since the VE was noted to be "frequent" in the report and if he has different recommendations we would let her know.

## 2022-09-12 NOTE — Telephone Encounter (Signed)
-----   Message from Orbie Pyo, MD sent at 09/11/2022  7:37 PM EDT ----- Joen Laura start Toprol-XL 25 mg at bedtime.

## 2022-11-22 NOTE — Progress Notes (Signed)
activities of daily living without any issues.  She really feels much better on the medication though she is generally against taking medications.    Current Medications: Current Meds  Medication Sig   aspirin 81 MG EC tablet Take 1 tablet (81 mg total) by mouth daily. Swallow whole.   fluticasone (FLONASE) 50 MCG/ACT nasal spray Place 2 sprays into both nostrils daily as needed for allergies.   metoprolol succinate (TOPROL XL) 25 MG 24 hr tablet Take 1 tablet (25 mg total) by mouth daily.   Multiple  Vitamins-Minerals (ADULT ONE DAILY GUMMIES) CHEW Chew 1 tablet by mouth daily.     Allergies:    Patient has no known allergies.   Social History:   Social History   Tobacco Use   Smoking status: Never   Smokeless tobacco: Never  Vaping Use   Vaping status: Never Used  Substance Use Topics   Alcohol use: No   Drug use: No     Family Hx: Family History  Problem Relation Age of Onset   Hypertension Mother    Kidney Stones Mother    Healthy Father    Heart attack Maternal Grandmother    Hyperlipidemia Maternal Grandmother    Heart attack Maternal Grandfather    Heart disease Maternal Grandfather    Diabetes Paternal Grandmother    COPD Paternal Grandmother    Heart disease Paternal Grandmother    Cancer Paternal Grandmother    Cancer Paternal Grandfather    Healthy Son    Healthy Son      Review of Systems:   Please see the history of present illness.    All other systems reviewed and are negative.     EKGs/Labs/Other Test Reviewed:   EKG: EKG performed February 2024 that I personally reviewed demonstrates sinus rhythm with PACs  EKG Interpretation Date/Time:    Ventricular Rate:    PR Interval:    QRS Duration:    QT Interval:    QTC Calculation:   R Axis:      Text Interpretation:          Prior CV studies reviewed: Cardiac Studies & Procedures       ECHOCARDIOGRAM  ECHOCARDIOGRAM COMPLETE 05/22/2022  Narrative ECHOCARDIOGRAM REPORT    Patient Name:   Laurie Mullins Date of Exam: 05/22/2022 Medical Rec #:  [id number]          Height:       68.0 in Accession #:    [id number]         Weight:       226.4 lb Date of Birth:  27-Mar-1985          BSA:          2.154 m Patient Age:    36 years           BP:           100/60 mmHg Patient Gender: F                  HR:           75 bpm. Exam Location:  [address]  Procedure: 2D Echo, Cardiac Doppler and Color Doppler  Indications:    R00.2 Palpitations  History:        Patient has no  prior history of Echocardiogram examinations.  Sonographer:    Sedonia Small Rodgers-Jones RDCS Referring Phys: [id number] CAMILLE L ANDY  IMPRESSIONS   1. Left ventricular ejection fraction, by estimation, is 55 to 60%.  Cardiology Office Note:   Date:  11/30/2022  ID:  [name], Mullins February 17, 1986, MRN [id number] PCP:  Willow Ora, MD  [facility] Providers Cardiologist:  Alverda Skeans, MD Referring MD: Willow Ora, MD   Chief Complaint/Reason for Referral: Cardiology follow-up ASSESSMENT:    1. Palpitations   2. Vertebral artery dissection (HCC)   3. CKD (chronic kidney disease) stage 2, GFR 60-89 ml/min     PLAN:   In order of problems listed above: 1.  Palpitations: Much improved on Toprol.  We did discuss the patient titrating this medication to 12.5 mg as tolerated and perhaps stopping the medication entirely to see if she can be off medications.  I will see her back in 1 year and then we will likely go to as needed follow-up. 2.  Vertebral artery dissection: Resolved on most recent CTA; continue aspirin monotherapy. 3.  CKD stage II: Monitor for now; strict blood pressure control.             Dispo:  Return in about 1 year (around 11/30/2023).      Medication Adjustments/Labs and Tests Ordered: Current medicines are reviewed at length with the patient today.  Concerns regarding medicines are outlined above.  The following changes have been made:  no change   Labs/tests ordered: No orders of the defined types were placed in this encounter.   Medication Changes: No orders of the defined types were placed in this encounter.   Current medicines are reviewed at length with the patient today.  The patient does not have concerns regarding medicines.  History of Present Illness:   FOCUSED PROBLEM LIST:   1.  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  CKD stage II   March 2004: The patient is a 37 y.o. female with the indicated medical history here for recommendations regarding palpitations.  She was seen by her PCP recently.  She had an episode lasting several hours of palpitations and fatigue.  The monitor which demonstrated a very low  frequency of ventricular and supraventricular ectopy.  Her echocardiogram that I reviewed demonstrates normal LV function with no significant valvular abnormalities with moderate RV dilatation..  A TSH was drawn last month which was within normal limits.   She continues to be bothered by palpitations.  They can happen for several days.  They happen yesterday.  They do not seem to be associated with caffeine intake or significant stress levels.  They are not associated with chest pain or shortness of breath.  Denies associated with presyncope or syncope.  They seem to be associated with her menstrual cycles however.  She denies any exertional chest pain, signs or symptoms of recurrent stroke, peripheral edema, or paroxysmal nocturnal dyspnea.  She does not smoke and she drinks only occasionally.  She does not snore and does not suffer from some daytime somnolence.  Plan: CMR to evaluate RV dilatation; patient to monitor for worsening palpitation burden.  September 2024: In the interim the patient had a CMR with an RV indexed size which was within normal limits.  She was referred for monitor which demonstrated frequent PVCs and Toprol-XL 25 mg at bedtime was initiated.  The Toprol has really helped her symptoms.  She feels much better.  In terms of her other symptoms she is relatively asymptomatic.  She denies any chest pain or dyspnea.  She has had no presyncope or syncope.  She is able to do all of her  activities of daily living without any issues.  She really feels much better on the medication though she is generally against taking medications.    Current Medications: Current Meds  Medication Sig   aspirin 81 MG EC tablet Take 1 tablet (81 mg total) by mouth daily. Swallow whole.   fluticasone (FLONASE) 50 MCG/ACT nasal spray Place 2 sprays into both nostrils daily as needed for allergies.   metoprolol succinate (TOPROL XL) 25 MG 24 hr tablet Take 1 tablet (25 mg total) by mouth daily.   Multiple  Vitamins-Minerals (ADULT ONE DAILY GUMMIES) CHEW Chew 1 tablet by mouth daily.     Allergies:    Patient has no known allergies.   Social History:   Social History   Tobacco Use   Smoking status: Never   Smokeless tobacco: Never  Vaping Use   Vaping status: Never Used  Substance Use Topics   Alcohol use: No   Drug use: No     Family Hx: Family History  Problem Relation Age of Onset   Hypertension Mother    Kidney Stones Mother    Healthy Father    Heart attack Maternal Grandmother    Hyperlipidemia Maternal Grandmother    Heart attack Maternal Grandfather    Heart disease Maternal Grandfather    Diabetes Paternal Grandmother    COPD Paternal Grandmother    Heart disease Paternal Grandmother    Cancer Paternal Grandmother    Cancer Paternal Grandfather    Healthy Son    Healthy Son      Review of Systems:   Please see the history of present illness.    All other systems reviewed and are negative.     EKGs/Labs/Other Test Reviewed:   EKG: EKG performed February 2024 that I personally reviewed demonstrates sinus rhythm with PACs  EKG Interpretation Date/Time:    Ventricular Rate:    PR Interval:    QRS Duration:    QT Interval:    QTC Calculation:   R Axis:      Text Interpretation:          Prior CV studies reviewed: Cardiac Studies & Procedures       ECHOCARDIOGRAM  ECHOCARDIOGRAM COMPLETE 05/22/2022  Narrative ECHOCARDIOGRAM REPORT    Patient Name:   Laurie Mullins Date of Exam: 05/22/2022 Medical Rec #:  [id number]          Height:       68.0 in Accession #:    [id number]         Weight:       226.4 lb Date of Birth:  27-Mar-1985          BSA:          2.154 m Patient Age:    36 years           BP:           100/60 mmHg Patient Gender: F                  HR:           75 bpm. Exam Location:  [address]  Procedure: 2D Echo, Cardiac Doppler and Color Doppler  Indications:    R00.2 Palpitations  History:        Patient has no  prior history of Echocardiogram examinations.  Sonographer:    Sedonia Small Rodgers-Jones RDCS Referring Phys: [id number] CAMILLE L ANDY  IMPRESSIONS   1. Left ventricular ejection fraction, by estimation, is 55 to 60%.  Cardiology Office Note:   Date:  11/30/2022  ID:  [name], Mullins February 17, 1986, MRN [id number] PCP:  Willow Ora, MD  [facility] Providers Cardiologist:  Alverda Skeans, MD Referring MD: Willow Ora, MD   Chief Complaint/Reason for Referral: Cardiology follow-up ASSESSMENT:    1. Palpitations   2. Vertebral artery dissection (HCC)   3. CKD (chronic kidney disease) stage 2, GFR 60-89 ml/min     PLAN:   In order of problems listed above: 1.  Palpitations: Much improved on Toprol.  We did discuss the patient titrating this medication to 12.5 mg as tolerated and perhaps stopping the medication entirely to see if she can be off medications.  I will see her back in 1 year and then we will likely go to as needed follow-up. 2.  Vertebral artery dissection: Resolved on most recent CTA; continue aspirin monotherapy. 3.  CKD stage II: Monitor for now; strict blood pressure control.             Dispo:  Return in about 1 year (around 11/30/2023).      Medication Adjustments/Labs and Tests Ordered: Current medicines are reviewed at length with the patient today.  Concerns regarding medicines are outlined above.  The following changes have been made:  no change   Labs/tests ordered: No orders of the defined types were placed in this encounter.   Medication Changes: No orders of the defined types were placed in this encounter.   Current medicines are reviewed at length with the patient today.  The patient does not have concerns regarding medicines.  History of Present Illness:   FOCUSED PROBLEM LIST:   1.  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  CKD stage II   March 2004: The patient is a 37 y.o. female with the indicated medical history here for recommendations regarding palpitations.  She was seen by her PCP recently.  She had an episode lasting several hours of palpitations and fatigue.  The monitor which demonstrated a very low  frequency of ventricular and supraventricular ectopy.  Her echocardiogram that I reviewed demonstrates normal LV function with no significant valvular abnormalities with moderate RV dilatation..  A TSH was drawn last month which was within normal limits.   She continues to be bothered by palpitations.  They can happen for several days.  They happen yesterday.  They do not seem to be associated with caffeine intake or significant stress levels.  They are not associated with chest pain or shortness of breath.  Denies associated with presyncope or syncope.  They seem to be associated with her menstrual cycles however.  She denies any exertional chest pain, signs or symptoms of recurrent stroke, peripheral edema, or paroxysmal nocturnal dyspnea.  She does not smoke and she drinks only occasionally.  She does not snore and does not suffer from some daytime somnolence.  Plan: CMR to evaluate RV dilatation; patient to monitor for worsening palpitation burden.  September 2024: In the interim the patient had a CMR with an RV indexed size which was within normal limits.  She was referred for monitor which demonstrated frequent PVCs and Toprol-XL 25 mg at bedtime was initiated.  The Toprol has really helped her symptoms.  She feels much better.  In terms of her other symptoms she is relatively asymptomatic.  She denies any chest pain or dyspnea.  She has had no presyncope or syncope.  She is able to do all of her  activities of daily living without any issues.  She really feels much better on the medication though she is generally against taking medications.    Current Medications: Current Meds  Medication Sig   aspirin 81 MG EC tablet Take 1 tablet (81 mg total) by mouth daily. Swallow whole.   fluticasone (FLONASE) 50 MCG/ACT nasal spray Place 2 sprays into both nostrils daily as needed for allergies.   metoprolol succinate (TOPROL XL) 25 MG 24 hr tablet Take 1 tablet (25 mg total) by mouth daily.   Multiple  Vitamins-Minerals (ADULT ONE DAILY GUMMIES) CHEW Chew 1 tablet by mouth daily.     Allergies:    Patient has no known allergies.   Social History:   Social History   Tobacco Use   Smoking status: Never   Smokeless tobacco: Never  Vaping Use   Vaping status: Never Used  Substance Use Topics   Alcohol use: No   Drug use: No     Family Hx: Family History  Problem Relation Age of Onset   Hypertension Mother    Kidney Stones Mother    Healthy Father    Heart attack Maternal Grandmother    Hyperlipidemia Maternal Grandmother    Heart attack Maternal Grandfather    Heart disease Maternal Grandfather    Diabetes Paternal Grandmother    COPD Paternal Grandmother    Heart disease Paternal Grandmother    Cancer Paternal Grandmother    Cancer Paternal Grandfather    Healthy Son    Healthy Son      Review of Systems:   Please see the history of present illness.    All other systems reviewed and are negative.     EKGs/Labs/Other Test Reviewed:   EKG: EKG performed February 2024 that I personally reviewed demonstrates sinus rhythm with PACs  EKG Interpretation Date/Time:    Ventricular Rate:    PR Interval:    QRS Duration:    QT Interval:    QTC Calculation:   R Axis:      Text Interpretation:          Prior CV studies reviewed: Cardiac Studies & Procedures       ECHOCARDIOGRAM  ECHOCARDIOGRAM COMPLETE 05/22/2022  Narrative ECHOCARDIOGRAM REPORT    Patient Name:   Laurie Mullins Date of Exam: 05/22/2022 Medical Rec #:  [id number]          Height:       68.0 in Accession #:    [id number]         Weight:       226.4 lb Date of Birth:  27-Mar-1985          BSA:          2.154 m Patient Age:    36 years           BP:           100/60 mmHg Patient Gender: F                  HR:           75 bpm. Exam Location:  [address]  Procedure: 2D Echo, Cardiac Doppler and Color Doppler  Indications:    R00.2 Palpitations  History:        Patient has no  prior history of Echocardiogram examinations.  Sonographer:    Sedonia Small Rodgers-Jones RDCS Referring Phys: [id number] CAMILLE L ANDY  IMPRESSIONS   1. Left ventricular ejection fraction, by estimation, is 55 to 60%.

## 2022-11-30 ENCOUNTER — Ambulatory Visit: Payer: 59 | Attending: Internal Medicine | Admitting: Internal Medicine

## 2022-11-30 ENCOUNTER — Encounter: Payer: Self-pay | Admitting: Internal Medicine

## 2022-11-30 VITALS — BP 114/72 | HR 77 | Ht 68.0 in | Wt 227.2 lb

## 2022-11-30 DIAGNOSIS — N182 Chronic kidney disease, stage 2 (mild): Secondary | ICD-10-CM | POA: Diagnosis not present

## 2022-11-30 DIAGNOSIS — I7774 Dissection of vertebral artery: Secondary | ICD-10-CM | POA: Diagnosis not present

## 2022-11-30 DIAGNOSIS — R002 Palpitations: Secondary | ICD-10-CM | POA: Diagnosis not present

## 2022-11-30 NOTE — Patient Instructions (Signed)
Medication Instructions:  NO CHANGES *If you need a refill on your cardiac medications before your next appointment, please call your pharmacy*   Lab Work: NONE If you have labs (blood work) drawn today and your tests are completely normal, you will receive your results only by: MyChart Message (if you have MyChart) OR A paper copy in the mail If you have any lab test that is abnormal or we need to change your treatment, we will call you to review the results.   Testing/Procedures: NONE   Follow-Up: At [facility], you and your health needs are our priority.  As part of our continuing mission to provide you with exceptional heart care, we have created designated Provider Care Teams.  These Care Teams include your primary Cardiologist (physician) and Advanced Practice Providers (APPs -  Physician Assistants and Nurse Practitioners) who all work together to provide you with the care you need, when you need it.   Your next appointment:   12 month(s)  Provider:   Orbie Pyo, MD

## 2022-12-06 ENCOUNTER — Other Ambulatory Visit (HOSPITAL_COMMUNITY): Payer: Self-pay

## 2023-02-15 ENCOUNTER — Ambulatory Visit (INDEPENDENT_AMBULATORY_CARE_PROVIDER_SITE_OTHER): Payer: 59 | Admitting: Family Medicine

## 2023-02-15 ENCOUNTER — Encounter: Payer: Self-pay | Admitting: Family Medicine

## 2023-02-15 VITALS — BP 110/80 | HR 80 | Temp 98.3°F | Ht 68.0 in | Wt 223.6 lb

## 2023-02-15 DIAGNOSIS — D0359 Melanoma in situ of other part of trunk: Secondary | ICD-10-CM | POA: Insufficient documentation

## 2023-02-15 DIAGNOSIS — I493 Ventricular premature depolarization: Secondary | ICD-10-CM

## 2023-02-15 DIAGNOSIS — I7774 Dissection of vertebral artery: Secondary | ICD-10-CM

## 2023-02-15 DIAGNOSIS — Z0000 Encounter for general adult medical examination without abnormal findings: Secondary | ICD-10-CM | POA: Diagnosis not present

## 2023-02-15 DIAGNOSIS — Z975 Presence of (intrauterine) contraceptive device: Secondary | ICD-10-CM | POA: Diagnosis not present

## 2023-02-15 DIAGNOSIS — Z8673 Personal history of transient ischemic attack (TIA), and cerebral infarction without residual deficits: Secondary | ICD-10-CM

## 2023-02-15 DIAGNOSIS — Z0001 Encounter for general adult medical examination with abnormal findings: Secondary | ICD-10-CM

## 2023-02-15 LAB — CBC WITH DIFFERENTIAL/PLATELET
Basophils Absolute: 0 10*3/uL (ref 0.0–0.1)
Basophils Relative: 0.5 % (ref 0.0–3.0)
Eosinophils Absolute: 0.1 10*3/uL (ref 0.0–0.7)
Eosinophils Relative: 1.2 % (ref 0.0–5.0)
HCT: 44.4 % (ref 36.0–46.0)
Hemoglobin: 15.1 g/dL — ABNORMAL HIGH (ref 12.0–15.0)
Lymphocytes Relative: 36.9 % (ref 12.0–46.0)
Lymphs Abs: 2.4 10*3/uL (ref 0.7–4.0)
MCHC: 33.9 g/dL (ref 30.0–36.0)
MCV: 89.7 fL (ref 78.0–100.0)
Monocytes Absolute: 0.4 10*3/uL (ref 0.1–1.0)
Monocytes Relative: 5.8 % (ref 3.0–12.0)
Neutro Abs: 3.6 10*3/uL (ref 1.4–7.7)
Neutrophils Relative %: 55.6 % (ref 43.0–77.0)
Platelets: 195 10*3/uL (ref 150.0–400.0)
RBC: 4.94 Mil/uL (ref 3.87–5.11)
RDW: 12.6 % (ref 11.5–15.5)
WBC: 6.4 10*3/uL (ref 4.0–10.5)

## 2023-02-15 LAB — LIPID PANEL
Cholesterol: 211 mg/dL — ABNORMAL HIGH (ref 0–200)
HDL: 39.7 mg/dL (ref >39.00)
LDL Cholesterol: 156 mg/dL — ABNORMAL HIGH (ref 0–99)
NonHDL: 170.87
Total CHOL/HDL Ratio: 5
Triglycerides: 72 mg/dL (ref 0.0–149.0)
VLDL: 14.4 mg/dL (ref 0.0–40.0)

## 2023-02-15 LAB — COMPREHENSIVE METABOLIC PANEL
ALT: 13 U/L (ref 0–35)
AST: 17 U/L (ref 0–37)
Albumin: 4.2 g/dL (ref 3.5–5.2)
Alkaline Phosphatase: 66 U/L (ref 39–117)
BUN: 13 mg/dL (ref 6–23)
CO2: 27 meq/L (ref 19–32)
Calcium: 8.9 mg/dL (ref 8.4–10.5)
Chloride: 104 meq/L (ref 96–112)
Creatinine, Ser: 0.67 mg/dL (ref 0.40–1.20)
GFR: 110.71 mL/min (ref >60.00)
Glucose, Bld: 94 mg/dL (ref 70–99)
Potassium: 4.3 meq/L (ref 3.5–5.1)
Sodium: 137 meq/L (ref 135–145)
Total Bilirubin: 0.5 mg/dL (ref 0.2–1.2)
Total Protein: 7.2 g/dL (ref 6.0–8.3)

## 2023-02-15 LAB — TSH: TSH: 1.12 u[IU]/mL (ref 0.35–5.50)

## 2023-02-15 NOTE — Progress Notes (Signed)
Subjective  Chief Complaint  Patient presents with   Annual Exam    Pt here for Annual Exam and is currently fasting     HPI: Laurie Mullins is a 37 y.o. female who presents to [facility] [place] today for a Female Wellness Visit.  She also has the concerns and/or needs as listed above in the chief complaint. These will be addressed in addition to the Health Maintenance Visit.   Wellness Visit: annual visit with health maintenance review and exam HM: screens are current. She is doing well. Imms up to date. Works full time, mother of 2 sons. Reviewed gyn notes Chronic disease management visit and/or acute problem visit: Discussed the use of AI scribe software for clinical note transcription with the patient, who gave verbal consent to proceed.  History of Present Illness   The patient, with a history of cardiac palpitations, reports significant improvement since the last visit. She had undergone a cardiology evaluation, which included wearing a heart monitor and an MRI of the heart. The patient notes that the palpitations were not initially captured on the monitor but were eventually identified and linked to her menstrual cycle. The patient has been managing these palpitations with Metoprolol, which she reports has been effective.  The patient also has an IUD for birth control, which she has had for several years. She is considering her options for future birth control as the IUD is nearing the end of its effectiveness. She expresses some apprehension about the process of having a new IUD inserted due to previous discomfort.  In addition to these primary concerns, the patient mentions a desire to lose weight and has been making efforts to exercise more regularly. She has two children and recently got a new puppy. She works mostly on weekends, which allows her to have more time during the week.  The patient was noted to have a concern about a diagnosis of chronic  kidney disease stage one on her chart per cardilogy which was explained as being due to a GFR at 77. Prior to that it was 111.    Assessment  1. Encounter for well adult exam with abnormal findings   2. PVCs (premature ventricular contractions)   3. Vertebral artery dissection (HCC)   4. History of cerebrovascular accident (CVA) involving cerebellum   5. Melanoma in situ of trunk (HCC)   6. IUD (intrauterine device) in place      Plan  Female Wellness Visit: Age appropriate Health Maintenance and Prevention measures were discussed with patient. Included topics are cancer screening recommendations, ways to keep healthy (see AVS) including dietary and exercise recommendations, regular eye and dental care, use of seat belts, and avoidance of moderate alcohol use and tobacco use.  BMI: discussed patient's BMI and encouraged positive lifestyle modifications to help get to or maintain a target BMI. HM needs and immunizations were addressed and ordered. See below for orders. See HM and immunization section for updates. Routine labs and screening tests ordered including cmp, cbc and lipids where appropriate. Discussed recommendations regarding Vit D and calcium supplementation (see AVS)  Chronic disease f/u and/or acute problem visit: (deemed necessary to be done in addition to the wellness visit): Assessment and Plan    Cardiac Palpitations/pvcs Significant improvement post-cardiology evaluation. Initial Holter monitor normal; subsequent monitoring linked symptoms to hormonal fluctuations. Metoprolol effective; cardiologist suggested dose reduction or discontinuation. Prefers half dose due to anxiety. Normal cardiac MRI. Discussed trial off metoprolol during calm  periods. Encouraged regular exercise. - Continue metoprolol 12.5 mg daily - Consider trial off metoprolol during calm periods - Encourage regular exercise  Chronic Kidney Disease Stage 1 Concern about CKD stage 1 diagnosis. reassured  Advised adequate hydration and regular monitoring. - Review latest kidney function tests - Ensure adequate hydration - Monitor kidney function regularly  Weight Management Actively losing weight, exercising regularly. Emphasized importance of healthy weight and physical activity. - Continue current exercise regimen - Encourage healthy eating habits  Birth Control Management Current Mirena IUD nearing expiration. Satisfied with minimal periods despite initial discomfort. Discussed continuing Mirena or husband's vasectomy. Prefers not to use oral contraceptives. Benefits of Mirena and vasectomy discussed. - Continue current Mirena IUD until expiration - Consider another Mirena IUD post-expiration - Discuss vasectomy option with husband  General Health Maintenance Received flu shot in October. Discussed cholesterol management per cardiologist's advice. - Monitor cholesterol levels - Continue routine health maintenance  Follow-up - Review lab results - Contact with results and further recommendations.     Annual check for cpe  Orders Placed This Encounter  Procedures   CBC with Differential/Platelet   Comprehensive metabolic panel   Lipid panel   TSH   No orders of the defined types were placed in this encounter.      Body mass index is 34 kg/m. Wt Readings from Last 3 Encounters:  02/15/23 223 lb 9.6 oz (101.4 kg)  11/30/22 227 lb 3.2 oz (103.1 kg)  05/31/22 226 lb (102.5 kg)     Patient Active Problem List   Diagnosis Date Noted Date Diagnosed   Melanoma in situ of trunk (HCC) 02/15/2023    IUD (intrauterine device) in place Mirena 2019 02/15/2023    PVCs (premature ventricular contractions) 05/16/2022     2024: EKG PACs, zio patch x 14 days: NSR, rare pvc. Negative.  Echo: right ventricle dilation: Referred to cardiology Cardiology workup included cardiac morphology scan which showed normal right ventricle and long-term heart monitor showing PVCs.  Started on  beta-blocker.    History of cerebrovascular accident (CVA) involving cerebellum 01/11/2022    Vertebral artery dissection (HCC) 09/22/2020    History of melanoma 09/22/2020    Cystic acne 11/29/2017    Health Maintenance  Topic Date Due   COVID-19 Vaccine (3 - Pfizer risk series) 03/03/2023 (Originally 12/10/2019)   DTaP/Tdap/Td (2 - Td or Tdap) 06/06/2024   Cervical Cancer Screening (HPV/Pap Cotest)  06/09/2026   INFLUENZA VACCINE  Completed   Hepatitis C Screening  Completed   HIV Screening  Completed   HPV VACCINES  Aged Out   Immunization History  Administered Date(s) Administered   Influenza,inj,Quad PF,6+ Mos 11/24/2021, 12/24/2021   Influenza-Unspecified 12/10/2013, 12/10/2017, 12/11/2022   PFIZER(Purple Top)SARS-COV-2 Vaccination 10/22/2019, 11/12/2019   Tdap 06/07/2014   We updated and reviewed the patient's past history in detail and it is documented below. Allergies: Patient  reports no history of alcohol use. Past Medical History Patient  has a past medical history of Atypical mole (01/01/2018), Atypical mole (01/01/2018), Atypical mole (01/01/2018), Atypical mole (04/17/2018), Atypical mole (04/17/2018), Atypical mole (04/17/2018), Atypical mole (04/17/2018), Atypical mole (11/03/2018), Atypical mole (11/03/2018), Atypical mole (11/03/2018), Atypical mole (11/03/2018), Atypical mole (05/12/2019), Cystic acne (11/29/2017), Dissection, vertebral artery (HCC), History of chicken pox, History of melanoma (09/22/2020), Melanoma (HCC) (01/01/2018), Melanoma in situ (HCC) (05/12/2019), PAC (premature atrial contraction), and Palpitations. Past Surgical History Patient  has a past surgical history that includes Wisdom tooth extraction; Wisdom tooth extraction; and Dilation and evacuation (Bilateral,  03/26/2013). Social History   Socioeconomic History   Marital status: Married    Spouse name: Not on file   Number of children: Not on file   Years of education: Not on file    Highest education level: Not on file  Occupational History    Employer: [employer]  Tobacco Use   Smoking status: Never   Smokeless tobacco: Never  Vaping Use   Vaping status: Never Used  Substance and Sexual Activity   Alcohol use: No   Drug use: No   Sexual activity: Yes    Birth control/protection: I.U.D.  Other Topics Concern   Not on file  Social History Narrative   Not on file   Social Determinants of Health   Financial Resource Strain: Not on file  Food Insecurity: Not on file  Transportation Needs: Not on file  Physical Activity: Not on file  Stress: Not on file  Social Connections: Not on file   Family History  Problem Relation Age of Onset   Hypertension Mother    Kidney Stones Mother    Healthy Father    Heart attack Maternal Grandmother    Hyperlipidemia Maternal Grandmother    Heart attack Maternal Grandfather    Heart disease Maternal Grandfather    Diabetes Paternal Grandmother    COPD Paternal Grandmother    Heart disease Paternal Grandmother    Cancer Paternal Grandmother    Cancer Paternal Grandfather    Healthy Son    Healthy Son     Review of Systems: Constitutional: negative for fever or malaise Ophthalmic: negative for photophobia, double vision or loss of vision Cardiovascular: negative for chest pain, dyspnea on exertion, or new LE swelling Respiratory: negative for SOB or persistent cough Gastrointestinal: negative for abdominal pain, change in bowel habits or melena Genitourinary: negative for dysuria or gross hematuria, no abnormal uterine bleeding or disharge Musculoskeletal: negative for new gait disturbance or muscular weakness Integumentary: negative for new or persistent rashes, no breast lumps Neurological: negative for TIA or stroke symptoms Psychiatric: negative for SI or delusions Allergic/Immunologic: negative for hives  Patient Care Team    Relationship Specialty Notifications Start End  Willow Ora, MD PCP -  General Family Medicine  08/15/22   Orbie Pyo, MD PCP - Cardiology Cardiology  11/30/22   Micki Riley, MD Consulting Physician Neurology  09/29/20   Janalyn Harder, MD (Inactive) Consulting Physician Dermatology  06/05/21   Carrington Clamp, MD Consulting Physician Obstetrics and Gynecology  02/15/23     Objective  Vitals: BP 110/80   Pulse 80   Temp 98.3 F (36.8 C)   Ht 5\' 8"  (1.727 m)   Wt 223 lb 9.6 oz (101.4 kg)   SpO2 98%   BMI 34.00 kg/m  General:  Well developed, well nourished, no acute distress  Psych:  Alert and orientedx3,normal mood and affect HEENT:  Normocephalic, atraumatic, non-icteric sclera, PERRL, supple neck without adenopathy, mass or thyromegaly Cardiovascular:  Normal S1, S2, RRR without gallop, rub or murmur Respiratory:  Good breath sounds bilaterally, CTAB with normal respiratory effort Gastrointestinal: normal bowel sounds, soft, non-tender, no noted masses. No HSM MSK: no deformities, contusions. Joints are without erythema or swelling.  Skin:  Warm, no rashes or suspicious lesions noted Neurologic:    Mental status is normal. Gross motor and sensory exams are normal. Normal gait. No tremor    Commons side effects, risks, benefits, and alternatives for medications and treatment plan prescribed today were discussed, and the  patient expressed understanding of the given instructions. Patient is instructed to call or message via MyChart if he/she has any questions or concerns regarding our treatment plan. No barriers to understanding were identified. We discussed Red Flag symptoms and signs in detail. Patient expressed understanding regarding what to do in case of urgent or emergency type symptoms.  Medication list was reconciled, printed and provided to the patient in AVS. Patient instructions and summary information was reviewed with the patient as documented in the AVS. This note was prepared with assistance of Dragon voice recognition software.  Occasional wrong-word or sound-a-like substitutions may have occurred due to the inherent limitations of voice recognition software .

## 2023-02-15 NOTE — Patient Instructions (Addendum)
Please return in 12 months for your annual complete physical; please come fasting.   I will release your lab results to you on your MyChart account with further instructions. You may see the results before I do, but when I review them I will send you a message with my report or have my assistant call you if things need to be discussed. Please reply to my message with any questions. Thank you!   If you have any questions or concerns, please don't hesitate to send me a message via MyChart or call the office at [phone]. Thank you for visiting with [place] today! It's our pleasure caring for you.   VISIT SUMMARY:  During today's visit, we discussed your significant improvement in managing cardiac palpitations, your current birth control options, weight management efforts, and concerns about chronic kidney disease. We also reviewed your general health maintenance and future follow-up plans.  YOUR PLAN:  -CARDIAC PALPITATIONS: Cardiac palpitations are feelings of having a fast-beating, fluttering, or pounding heart. Your symptoms have improved significantly with Metoprolol, and we discussed the possibility of reducing or discontinuing the medication during calm periods. Continue taking Metoprolol 12.5 mg daily and maintain regular exercise.  -CHRONIC KIDNEY DISEASE STAGE 1: I reviewed your chart and your kidney function was NORMAL in November 2023, and only very minimally off in february. I will recheck today.   -WEIGHT MANAGEMENT: You are actively working on losing weight and exercising regularly. Continue with your current exercise regimen and focus on healthy eating habits to maintain a healthy weight.  -BIRTH CONTROL MANAGEMENT: Your current Mirena IUD is nearing expiration. We discussed continuing with another Mirena IUD or considering your husband's vasectomy. You prefer not to use oral contraceptives. Continue with your current IUD until it expires and discuss the vasectomy option with your  husband.  -GENERAL HEALTH MAINTENANCE: You received a flu shot in October and we discussed managing your cholesterol levels as advised by your cardiologist. Continue with routine health maintenance and monitor your cholesterol levels.  INSTRUCTIONS:  We will review your lab results and contact you with further recommendations. Please ensure you stay hydrated and continue with your current exercise and health maintenance routines.

## 2023-02-18 NOTE — Progress Notes (Signed)
Labs reviewed.  The ASCVD Risk score (Arnett DK, et al., 2019) failed to calculate for the following reasons:   The 2019 ASCVD risk score is only valid for ages 45 to 38   The patient has a prior MI or stroke diagnosis  Dear Ms. Kulpa, Thank you for allowing me to care for you at your recent office visit.  I wanted to let you know that I have reviewed your lab test results and am happy to report that they are mostly all normal.  Your cholesterol levels are a bit higher; please eat a clean diet and we will recheck next year. I may suggest cholesterol lowering medication in the future if levels continue to rise.  Happy holidays!  Sincerely, Dr. Mardelle Matte

## 2023-03-28 ENCOUNTER — Other Ambulatory Visit: Payer: Self-pay

## 2023-03-28 ENCOUNTER — Other Ambulatory Visit (HOSPITAL_COMMUNITY): Payer: Self-pay

## 2023-07-17 DIAGNOSIS — Z01419 Encounter for gynecological examination (general) (routine) without abnormal findings: Secondary | ICD-10-CM | POA: Diagnosis not present

## 2023-07-26 DIAGNOSIS — D485 Neoplasm of uncertain behavior of skin: Secondary | ICD-10-CM | POA: Diagnosis not present

## 2023-07-26 DIAGNOSIS — D2262 Melanocytic nevi of left upper limb, including shoulder: Secondary | ICD-10-CM | POA: Diagnosis not present

## 2023-07-26 DIAGNOSIS — D2261 Melanocytic nevi of right upper limb, including shoulder: Secondary | ICD-10-CM | POA: Diagnosis not present

## 2023-07-26 DIAGNOSIS — D1801 Hemangioma of skin and subcutaneous tissue: Secondary | ICD-10-CM | POA: Diagnosis not present

## 2023-07-26 DIAGNOSIS — D225 Melanocytic nevi of trunk: Secondary | ICD-10-CM | POA: Diagnosis not present

## 2023-07-26 DIAGNOSIS — D224 Melanocytic nevi of scalp and neck: Secondary | ICD-10-CM | POA: Diagnosis not present

## 2023-07-26 DIAGNOSIS — D2271 Melanocytic nevi of right lower limb, including hip: Secondary | ICD-10-CM | POA: Diagnosis not present

## 2023-07-26 DIAGNOSIS — Z85820 Personal history of malignant melanoma of skin: Secondary | ICD-10-CM | POA: Diagnosis not present

## 2023-07-26 DIAGNOSIS — D2272 Melanocytic nevi of left lower limb, including hip: Secondary | ICD-10-CM | POA: Diagnosis not present

## 2023-08-07 ENCOUNTER — Other Ambulatory Visit (HOSPITAL_COMMUNITY): Payer: Self-pay

## 2023-08-07 MED ORDER — TRETINOIN 0.025 % EX CREA
TOPICAL_CREAM | CUTANEOUS | 11 refills | Status: AC
  Filled 2023-08-07: qty 20, 30d supply, fill #0
  Filled 2024-01-06 – 2024-01-17 (×2): qty 20, 30d supply, fill #1

## 2023-08-22 DIAGNOSIS — N939 Abnormal uterine and vaginal bleeding, unspecified: Secondary | ICD-10-CM | POA: Diagnosis not present

## 2023-08-26 ENCOUNTER — Other Ambulatory Visit (HOSPITAL_COMMUNITY): Payer: Self-pay

## 2023-11-13 DIAGNOSIS — D2272 Melanocytic nevi of left lower limb, including hip: Secondary | ICD-10-CM | POA: Diagnosis not present

## 2023-11-13 DIAGNOSIS — D485 Neoplasm of uncertain behavior of skin: Secondary | ICD-10-CM | POA: Diagnosis not present

## 2023-12-27 ENCOUNTER — Telehealth: Payer: Self-pay | Admitting: Internal Medicine

## 2023-12-27 NOTE — Telephone Encounter (Signed)
 Called pt to make 1 year f/u from recall/ She does not wish to make appt because she is feeling fine.

## 2024-01-06 ENCOUNTER — Other Ambulatory Visit: Payer: Self-pay | Admitting: Internal Medicine

## 2024-01-06 ENCOUNTER — Other Ambulatory Visit (HOSPITAL_COMMUNITY): Payer: Self-pay

## 2024-01-07 ENCOUNTER — Other Ambulatory Visit (HOSPITAL_COMMUNITY): Payer: Self-pay

## 2024-01-07 ENCOUNTER — Other Ambulatory Visit: Payer: Self-pay

## 2024-01-07 ENCOUNTER — Encounter: Payer: Self-pay | Admitting: Pharmacist

## 2024-01-08 ENCOUNTER — Other Ambulatory Visit: Payer: Self-pay

## 2024-01-08 ENCOUNTER — Other Ambulatory Visit (HOSPITAL_COMMUNITY): Payer: Self-pay

## 2024-01-08 MED ORDER — METOPROLOL SUCCINATE ER 25 MG PO TB24
25.0000 mg | ORAL_TABLET | Freq: Every day | ORAL | 0 refills | Status: AC
  Filled 2024-01-08: qty 30, 30d supply, fill #0

## 2024-01-10 ENCOUNTER — Other Ambulatory Visit: Payer: Self-pay

## 2024-01-17 ENCOUNTER — Other Ambulatory Visit: Payer: Self-pay

## 2024-02-18 ENCOUNTER — Encounter: Payer: 59 | Admitting: Family Medicine

## 2024-02-25 ENCOUNTER — Ambulatory Visit: Payer: Self-pay | Admitting: Family Medicine

## 2024-02-25 ENCOUNTER — Ambulatory Visit: Admitting: Family Medicine

## 2024-02-25 ENCOUNTER — Encounter: Payer: Self-pay | Admitting: Family Medicine

## 2024-02-25 VITALS — BP 104/70 | HR 50 | Temp 97.7°F | Ht 68.0 in | Wt 171.2 lb

## 2024-02-25 DIAGNOSIS — Z85820 Personal history of malignant melanoma of skin: Secondary | ICD-10-CM

## 2024-02-25 DIAGNOSIS — N951 Menopausal and female climacteric states: Secondary | ICD-10-CM

## 2024-02-25 DIAGNOSIS — Z8673 Personal history of transient ischemic attack (TIA), and cerebral infarction without residual deficits: Secondary | ICD-10-CM

## 2024-02-25 DIAGNOSIS — Z0001 Encounter for general adult medical examination with abnormal findings: Secondary | ICD-10-CM

## 2024-02-25 DIAGNOSIS — Z975 Presence of (intrauterine) contraceptive device: Secondary | ICD-10-CM | POA: Diagnosis not present

## 2024-02-25 DIAGNOSIS — I493 Ventricular premature depolarization: Secondary | ICD-10-CM

## 2024-02-25 LAB — COMPREHENSIVE METABOLIC PANEL WITH GFR
ALT: 17 U/L (ref 3–35)
AST: 14 U/L (ref 5–37)
Albumin: 4.1 g/dL (ref 3.5–5.2)
Alkaline Phosphatase: 49 U/L (ref 39–117)
BUN: 14 mg/dL (ref 6–23)
CO2: 26 meq/L (ref 19–32)
Calcium: 8.8 mg/dL (ref 8.4–10.5)
Chloride: 105 meq/L (ref 96–112)
Creatinine, Ser: 0.67 mg/dL (ref 0.40–1.20)
GFR: 109.91 mL/min (ref >60.00)
Glucose, Bld: 87 mg/dL (ref 70–99)
Potassium: 4.2 meq/L (ref 3.5–5.1)
Sodium: 138 meq/L (ref 135–145)
Total Bilirubin: 0.6 mg/dL (ref 0.2–1.2)
Total Protein: 6.8 g/dL (ref 6.0–8.3)

## 2024-02-25 LAB — CBC WITH DIFFERENTIAL/PLATELET
Basophils Absolute: 0 K/uL (ref 0.0–0.1)
Basophils Relative: 0.3 % (ref 0.0–3.0)
Eosinophils Absolute: 0.1 K/uL (ref 0.0–0.7)
Eosinophils Relative: 1 % (ref 0.0–5.0)
HCT: 43.2 % (ref 36.0–46.0)
Hemoglobin: 14.3 g/dL (ref 12.0–15.0)
Lymphocytes Relative: 38 % (ref 12.0–46.0)
Lymphs Abs: 2.1 K/uL (ref 0.7–4.0)
MCHC: 33.1 g/dL (ref 30.0–36.0)
MCV: 90.1 fl (ref 78.0–100.0)
Monocytes Absolute: 0.3 K/uL (ref 0.1–1.0)
Monocytes Relative: 4.5 % (ref 3.0–12.0)
Neutro Abs: 3.1 K/uL (ref 1.4–7.7)
Neutrophils Relative %: 56.2 % (ref 43.0–77.0)
Platelets: 158 K/uL (ref 150.0–400.0)
RBC: 4.8 Mil/uL (ref 3.87–5.11)
RDW: 12.8 % (ref 11.5–15.5)
WBC: 5.6 K/uL (ref 4.0–10.5)

## 2024-02-25 LAB — LIPID PANEL
Cholesterol: 190 mg/dL (ref 28–200)
HDL: 48.2 mg/dL (ref >39.00)
LDL Cholesterol: 131 mg/dL — ABNORMAL HIGH (ref 10–99)
NonHDL: 141.58
Total CHOL/HDL Ratio: 4
Triglycerides: 55 mg/dL (ref 10.0–149.0)
VLDL: 11 mg/dL (ref 0.0–40.0)

## 2024-02-25 LAB — TSH: TSH: 1.27 u[IU]/mL (ref 0.35–5.50)

## 2024-02-25 NOTE — Progress Notes (Signed)
 Subjective  Chief Complaint  Patient presents with   Annual Exam    Pt here for Annual Exam and is currently fasting     HPI: Laurie Mullins is a 38 y.o. female who presents to [facility] [place] today for a Female Wellness Visit.  She also has the concerns and/or needs as listed above in the chief complaint. These will be addressed in addition to the Health Maintenance Visit.   Wellness Visit: annual visit with health maintenance review and exam HM: sees GYN; pap up to date. Mirena  iud in place since 2019. Having regular periods now. Describes emotional lability premenstrually at different months; some anxiety: perimenopausal related.  Has lost 60 pounds with diet and exercise!!!  Chronic disease management visit and/or acute problem visit: PVCs are controlled with low dose bb. No cp. Prn for cards f/u.  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 Stable.  H/o melanoma; sees derm regularly. Has had atypical moles but no recurrences.   Assessment  1. Encounter for well adult exam with abnormal findings   2. PVCs (premature ventricular contractions)   3. Perimenopausal symptoms   4. IUD (intrauterine device) in place Mirena  2019   5. History of cerebrovascular accident (CVA) involving cerebellum   6. History of melanoma      Plan  Female Wellness Visit: Age appropriate Health Maintenance and Prevention measures were discussed with patient. Included topics are cancer screening recommendations, ways to keep healthy (see AVS) including dietary and exercise recommendations, regular eye and dental care, use of seat belts, and avoidance of moderate alcohol use and tobacco use. Pap up to date. Will start mammograms next year.  BMI: discussed patient's BMI and encouraged positive lifestyle modifications to help get to or maintain a target BMI. HM needs and immunizations were addressed and ordered. See below for orders. See HM and immunization  section for updates. Routine labs and screening tests ordered including cmp, cbc and lipids where appropriate. Discussed recommendations regarding Vit D and calcium  supplementation (see AVS)  Chronic disease f/u and/or acute problem visit: (deemed necessary to be done in addition to the wellness visit): Weight mgt:  congratulated on amazing weight loss and exercise program. Continue.  PVCs are controlled. Discussed management of perimenopausal sxs. Check random estradiol  and progesterone . Consider premnestrual low dose estradiol  patch to help emotional sxs. Pt to consider. Continue mirena  IUD H/o CVA: on asa and stable H/o melanoma: annual derm surveillance  Follow up: 12 mo for cpe  Orders Placed This Encounter  Procedures   CBC with Differential/Platelet   Comprehensive metabolic panel with GFR   Lipid panel   TSH   Progesterone    Estradiol    No orders of the defined types were placed in this encounter.      Body mass index is 26.03 kg/m. Wt Readings from Last 3 Encounters:  02/25/24 171 lb 3.2 oz (77.7 kg)  02/15/23 223 lb 9.6 oz (101.4 kg)  11/30/22 227 lb 3.2 oz (103.1 kg)   Need for contraception: Yes, IUD  Patient Active Problem List   Diagnosis Date Noted Date Diagnosed   Melanoma in situ of trunk (HCC) 02/15/2023    IUD (intrauterine device) in place Mirena  2019 02/15/2023    PVCs (premature ventricular contractions) 05/16/2022     2024: EKG PACs, zio patch x 14 days: NSR, rare pvc. Negative.  Echo: right ventricle dilation: Referred to cardiology Cardiology workup included cardiac morphology scan which showed normal right ventricle  and long-term heart monitor showing PVCs.  Started on beta-blocker.    History of cerebrovascular accident (CVA) involving cerebellum 01/11/2022    Vertebral artery dissection 09/22/2020    History of melanoma 09/22/2020    Cystic acne 11/29/2017    Health Maintenance  Topic Date Due   Hepatitis B Vaccines 19-59 Average Risk (1  of 3 - 19+ 3-dose series) Never done   HPV VACCINES (1 - Risk 3-dose SCDM series) Never done   Influenza Vaccine  10/11/2023   COVID-19 Vaccine (3 - Pfizer risk series) 03/12/2024 (Originally 12/10/2019)   DTaP/Tdap/Td (2 - Td or Tdap) 06/06/2024   Cervical Cancer Screening (HPV/Pap Cotest)  06/09/2026   Hepatitis C Screening  Completed   HIV Screening  Completed   Pneumococcal Vaccine  Aged Out   Meningococcal B Vaccine  Aged Out   Immunization History  Administered Date(s) Administered   Influenza,inj,Quad PF,6+ Mos 11/24/2021, 12/24/2021   Influenza-Unspecified 12/10/2013, 12/10/2017, 12/11/2022   PFIZER(Purple Top)SARS-COV-2 Vaccination 10/22/2019, 11/12/2019   Tdap 06/07/2014   We updated and reviewed the patient's past history in detail and it is documented below. Allergies: Patient  reports no history of alcohol use. Past Medical History Patient  has a past medical history of Atypical mole (01/01/2018), Atypical mole (01/01/2018), Atypical mole (01/01/2018), Atypical mole (04/17/2018), Atypical mole (04/17/2018), Atypical mole (04/17/2018), Atypical mole (04/17/2018), Atypical mole (11/03/2018), Atypical mole (11/03/2018), Atypical mole (11/03/2018), Atypical mole (11/03/2018), Atypical mole (05/12/2019), Cystic acne (11/29/2017), Dissection, vertebral artery, History of chicken pox, History of melanoma (09/22/2020), Melanoma (HCC) (01/01/2018), Melanoma in situ (HCC) (05/12/2019), PAC (premature atrial contraction), and Palpitations. Past Surgical History Patient  has a past surgical history that includes Wisdom tooth extraction; Wisdom tooth extraction; and Dilation and evacuation (Bilateral, 03/26/2013). Social History   Socioeconomic History   Marital status: Married    Spouse name: Not on file   Number of children: Not on file   Years of education: Not on file   Highest education level: Bachelor's degree (e.g., BA, AB, BS)  Occupational History    Employer: [employer]   Tobacco Use   Smoking status: Never   Smokeless tobacco: Never  Vaping Use   Vaping status: Never Used  Substance and Sexual Activity   Alcohol use: No   Drug use: No   Sexual activity: Yes    Birth control/protection: I.U.D.  Other Topics Concern   Not on file  Social History Narrative   Not on file   Social Drivers of Health   Tobacco Use: Low Risk (02/25/2024)   Patient History    Smoking Tobacco Use: Never    Smokeless Tobacco Use: Never    Passive Exposure: Not on file  Financial Resource Strain: Low Risk (02/24/2024)   Overall Financial Resource Strain (CARDIA)    Difficulty of Paying Living Expenses: Not very hard  Food Insecurity: No Food Insecurity (02/24/2024)   Epic    Worried About Radiation Protection Practitioner of Food in the Last Year: Never true    Ran Out of Food in the Last Year: Never true  Transportation Needs: No Transportation Needs (02/24/2024)   Epic    Lack of Transportation (Medical): No    Lack of Transportation (Non-Medical): No  Physical Activity: Sufficiently Active (02/24/2024)   Exercise Vital Sign    Days of Exercise per Week: 4 days    Minutes of Exercise per Session: 60 min  Stress: No Stress Concern Present (02/24/2024)   [employer] of Occupational Health - Occupational Stress Questionnaire  Feeling of Stress: Not at all  Social Connections: Moderately Integrated (02/24/2024)   Social Connection and Isolation Panel    Frequency of Communication with Friends and Family: More than three times a week    Frequency of Social Gatherings with Friends and Family: Once a week    Attends Religious Services: 1 to 4 times per year    Active Member of [employer] or Organizations: No    Attends Banker Meetings: Not on file    Marital Status: Married  Depression (PHQ2-9): Low Risk (02/25/2024)   Depression (PHQ2-9)    PHQ-2 Score: 0  Alcohol Screen: Low Risk (02/24/2024)   Alcohol Screen    Last Alcohol Screening Score (AUDIT): 1  Housing:  Low Risk (02/24/2024)   Epic    Unable to Pay for Housing in the Last Year: No    Number of Times Moved in the Last Year: 0    Homeless in the Last Year: No  Utilities: Not on file  Health Literacy: Not on file   Family History  Problem Relation Age of Onset   Hypertension Mother    Kidney Stones Mother    Healthy Father    Heart attack Maternal Grandmother    Hyperlipidemia Maternal Grandmother    Heart attack Maternal Grandfather    Heart disease Maternal Grandfather    Diabetes Paternal Grandmother    COPD Paternal Grandmother    Heart disease Paternal Grandmother    Cancer Paternal Grandmother    Cancer Paternal Grandfather    Healthy Son    Healthy Son     Review of Systems: Constitutional: negative for fever or malaise Ophthalmic: negative for photophobia, double vision or loss of vision Cardiovascular: negative for chest pain, dyspnea on exertion, or new LE swelling Respiratory: negative for SOB or persistent cough Gastrointestinal: negative for abdominal pain, change in bowel habits or melena Genitourinary: negative for dysuria or gross hematuria, no abnormal uterine bleeding or disharge Musculoskeletal: negative for new gait disturbance or muscular weakness Integumentary: negative for new or persistent rashes, no breast lumps Neurological: negative for TIA or stroke symptoms Psychiatric: negative for SI or delusions Allergic/Immunologic: negative for hives  Patient Care Team    Relationship Specialty Notifications Start End  Jodie Lavern CROME, MD PCP - General Family Medicine  08/15/22   Thukkani, Arun K, MD PCP - Cardiology Cardiology  11/30/22   Rosemarie Eather RAMAN, MD Consulting Physician Neurology  09/29/20   Livingston Rigg, MD Consulting Physician Dermatology  06/05/21   Sarrah Browning, MD Consulting Physician Obstetrics and Gynecology  02/15/23     Objective  Vitals: BP 104/70   Pulse (!) 50   Temp 97.7 F (36.5 C)   Ht 5' 8 (1.727 m)   Wt 171 lb 3.2 oz  (77.7 kg)   SpO2 99%   BMI 26.03 kg/m  General:  Well developed, well nourished, no acute distress  Psych:  Alert and orientedx3,normal mood and affect HEENT:  Normocephalic, atraumatic, non-icteric sclera, PERRL, supple neck without adenopathy, mass or thyromegaly Cardiovascular:  Normal S1, S2, RRR without gallop, rub or murmur Respiratory:  Good breath sounds bilaterally, CTAB with normal respiratory effort Gastrointestinal: normal bowel sounds, soft, non-tender, no noted masses. No HSM MSK: no deformities, contusions. Joints are without erythema or swelling.  Skin:  Warm, no rashes or suspicious lesions noted Neurologic:    Mental status is normal. Gross motor and sensory exams are normal. Normal gait. No tremor    Commons side effects, risks,  benefits, and alternatives for medications and treatment plan prescribed today were discussed, and the patient expressed understanding of the given instructions. Patient is instructed to call or message via MyChart if he/she has any questions or concerns regarding our treatment plan. No barriers to understanding were identified. We discussed Red Flag symptoms and signs in detail. Patient expressed understanding regarding what to do in case of urgent or emergency type symptoms.  Medication list was reconciled, printed and provided to the patient in AVS. Patient instructions and summary information was reviewed with the patient as documented in the AVS. This note was prepared with assistance of Dragon voice recognition software. Occasional wrong-word or sound-a-like substitutions may have occurred due to the inherent limitations of voice recognition software .

## 2024-02-25 NOTE — Patient Instructions (Signed)
 Please return in 12 months for your annual complete physical; please come fasting.   I will release your lab results to you on your MyChart account with further instructions. You may see the results before I do, but when I review them I will send you a message with my report or have my assistant call you if things need to be discussed. Please reply to my message with any questions. Thank you!   If you have any questions or concerns, please don't hesitate to send me a message via MyChart or call the office at [phone]. Thank you for visiting with us  today! It's our pleasure caring for you.    VISIT SUMMARY: During your visit, we discussed your hormonal fluctuations, heart palpitations, and history of carotid artery dissection. We reviewed your significant lifestyle changes, including weight loss and regular exercise, and explored various treatment options for your symptoms. Routine lab work was ordered to better understand your hormone levels.  YOUR PLAN: -HORMONAL FLUCTUATIONS AND MENSTRUAL IRREGULARITIES: Hormonal fluctuations can cause symptoms like emotional variability, anxiety, and changes in menstrual cycles. We discussed hormone replacement therapy options, including bioidentical hormones and patches, to help manage these symptoms. Basic lab work, including estrogen and progesterone  levels, was ordered to better understand your hormone levels.  -PREMATURE VENTRICULAR CONTRACTIONS: Premature ventricular contractions are extra heartbeats that begin in one of your heart's two lower pumping chambers. You are currently managing this with metoprolol , which you should continue as prescribed. We discussed how hormone fluctuations might impact these symptoms.  -HISTORY OF CAROTID ARTERY DISSECTION: A carotid artery dissection is a tear in one of the major arteries in your neck. You have a history of this condition and are currently managing it with daily baby aspirin . We emphasized the importance of  continued monitoring and management.  INSTRUCTIONS: Please continue taking your medications as prescribed. Follow up with the lab work ordered to check your estrogen and progesterone  levels. We will review the results and discuss further treatment options based on the findings.                      Contains text generated by [employer]bridge.                                 Contains text generated by Abridge.

## 2024-02-25 NOTE — Progress Notes (Signed)
 See mychart note Dear Ms. Doberstein, It was so good seeing you today Your lab results all look great! Your amazing and powerful :) Happy holidays.  Sincerely, Dr. Jodie

## 2024-02-26 LAB — ESTRADIOL: Estradiol: 272 pg/mL

## 2024-02-26 LAB — PROGESTERONE: Progesterone: <0.5 ng/mL

## 2024-02-26 NOTE — Progress Notes (Signed)
 See mychart note

## 2025-03-02 ENCOUNTER — Encounter: Admitting: Family Medicine
